# Patient Record
Sex: Female | Born: 1970
Health system: Southern US, Community
[De-identification: ages and names within clinical notes are randomized; demographics above are authoritative.]

## PROBLEM LIST (undated history)

## (undated) DIAGNOSIS — F1910 Other psychoactive substance abuse, uncomplicated: Secondary | ICD-10-CM

## (undated) DIAGNOSIS — N83209 Unspecified ovarian cyst, unspecified side: Secondary | ICD-10-CM

## (undated) DIAGNOSIS — M6740 Ganglion, unspecified site: Secondary | ICD-10-CM

## (undated) DIAGNOSIS — N879 Dysplasia of cervix uteri, unspecified: Secondary | ICD-10-CM

## (undated) DIAGNOSIS — F32A Depression, unspecified: Secondary | ICD-10-CM

## (undated) HISTORY — DX: Other psychoactive substance abuse, uncomplicated: F19.10

## (undated) HISTORY — DX: Dysplasia of cervix uteri, unspecified: N87.9

## (undated) HISTORY — PX: GYNECOLOGIC CRYOSURGERY: SHX857

## (undated) HISTORY — PX: TUBAL LIGATION: SHX77

## (undated) HISTORY — DX: Depression, unspecified: F32.A

## (undated) HISTORY — PX: OVARIAN CYST REMOVAL: SHX89

## (undated) HISTORY — DX: Unspecified ovarian cyst, unspecified side: N83.209

## (undated) HISTORY — DX: Ganglion, unspecified site: M67.40

---
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
## 1997-12-30 ENCOUNTER — Other Ambulatory Visit: Admission: RE | Admit: 1997-12-30 | Discharge: 1997-12-30 | Payer: Self-pay | Admitting: Obstetrics & Gynecology

## 1998-03-25 ENCOUNTER — Ambulatory Visit (HOSPITAL_COMMUNITY): Admission: RE | Admit: 1998-03-25 | Discharge: 1998-03-25 | Payer: Self-pay | Admitting: Obstetrics and Gynecology

## 1998-03-25 ENCOUNTER — Encounter: Payer: Self-pay | Admitting: Obstetrics and Gynecology

## 1998-04-23 ENCOUNTER — Ambulatory Visit (HOSPITAL_COMMUNITY): Admission: RE | Admit: 1998-04-23 | Discharge: 1998-04-23 | Payer: Self-pay | Admitting: *Deleted

## 1998-06-11 ENCOUNTER — Inpatient Hospital Stay (HOSPITAL_COMMUNITY): Admission: AD | Admit: 1998-06-11 | Discharge: 1998-06-14 | Payer: Self-pay | Admitting: *Deleted

## 2006-02-19 ENCOUNTER — Emergency Department (HOSPITAL_COMMUNITY): Admission: EM | Admit: 2006-02-19 | Discharge: 2006-02-19 | Payer: Self-pay | Admitting: Emergency Medicine

## 2006-12-31 ENCOUNTER — Emergency Department (HOSPITAL_COMMUNITY): Admission: EM | Admit: 2006-12-31 | Discharge: 2006-12-31 | Payer: Self-pay | Admitting: Family Medicine

## 2008-04-06 ENCOUNTER — Emergency Department (HOSPITAL_COMMUNITY): Admission: EM | Admit: 2008-04-06 | Discharge: 2008-04-06 | Payer: Self-pay | Admitting: Family Medicine

## 2008-10-09 ENCOUNTER — Emergency Department (HOSPITAL_COMMUNITY): Admission: EM | Admit: 2008-10-09 | Discharge: 2008-10-09 | Payer: Self-pay | Admitting: Family Medicine

## 2009-04-12 ENCOUNTER — Ambulatory Visit: Payer: Self-pay | Admitting: Family Medicine

## 2009-06-08 ENCOUNTER — Ambulatory Visit: Payer: Self-pay | Admitting: Family Medicine

## 2009-06-08 ENCOUNTER — Encounter: Admission: RE | Admit: 2009-06-08 | Discharge: 2009-06-08 | Payer: Self-pay | Admitting: Family Medicine

## 2010-11-02 ENCOUNTER — Encounter: Payer: Self-pay | Admitting: Family Medicine

## 2010-11-14 ENCOUNTER — Encounter: Payer: Self-pay | Admitting: Gynecology

## 2010-11-14 ENCOUNTER — Ambulatory Visit: Payer: Self-pay | Admitting: Gynecology

## 2010-11-14 ENCOUNTER — Other Ambulatory Visit (HOSPITAL_COMMUNITY)
Admission: RE | Admit: 2010-11-14 | Discharge: 2010-11-14 | Disposition: A | Discharge status: Home / Self Care | Payer: BC Managed Care – PPO | Source: Ambulatory Visit | Attending: Gynecology | Admitting: Gynecology

## 2010-11-14 ENCOUNTER — Ambulatory Visit (INDEPENDENT_AMBULATORY_CARE_PROVIDER_SITE_OTHER): Payer: BC Managed Care – PPO | Admitting: Gynecology

## 2010-11-14 DIAGNOSIS — Z01419 Encounter for gynecological examination (general) (routine) without abnormal findings: Secondary | ICD-10-CM | POA: Insufficient documentation

## 2010-11-14 DIAGNOSIS — N898 Other specified noninflammatory disorders of vagina: Secondary | ICD-10-CM

## 2010-11-14 DIAGNOSIS — Z13220 Encounter for screening for lipoid disorders: Secondary | ICD-10-CM

## 2010-11-14 DIAGNOSIS — B373 Candidiasis of vulva and vagina: Secondary | ICD-10-CM

## 2010-11-14 DIAGNOSIS — R823 Hemoglobinuria: Secondary | ICD-10-CM

## 2010-11-14 DIAGNOSIS — Z131 Encounter for screening for diabetes mellitus: Secondary | ICD-10-CM

## 2010-11-14 MED ORDER — FLUCONAZOLE 150 MG PO TABS: 150.0000 mg | ORAL_TABLET | Freq: Once | ORAL | Status: AC

## 2010-11-14 NOTE — Progress Notes (Signed)
Jade Richards 16-Sep-1970 [id number]        40 y.o.  for annual exam.  12 years since her last exam notes a vaginal discharge over the last several months.  Past medical history,surgical history, medications, allergies, family history and social history were all reviewed and documented in the EPIC chart. ROS:  Was performed and pertinent positives and negatives are included in the history.  Exam: chaperone present Filed Vitals:   11/14/10 1422  BP: 134/78   General appearance  Normal Skin grossly normal Head/Neck normal with no cervical or supraclavicular adenopathy thyroid normal Lungs  clear Cardiac RR, without RMG Abdominal  soft, nontender, without masses, organomegaly or hernia.  Midline scar noted Breasts  examined lying and sitting without masses, retractions, discharge or axillary adenopathy. Pelvic  Ext/BUS/vagina  normal with slight white discharge  Cervix  normal  Pap done  Uterus  anteverted, normal size, shape and contour, midline and mobile nontender   Adnexa  Without masses or tenderness    Anus and perineum  normal   Rectovaginal  normal sphincter tone without palpated masses or tenderness.    Assessment/Plan:  40 y.o. female for annual exam.    1. Vaginal discharge. KOH wet prep positive for yeast we'll treat with Diflucan 150x1 dose follow up if symptoms persist or recur. 2. Health maintenance. History of tubal sterilization. SBE monthly reviewed. Screening mammogram recommendation is between 35 and 40 were reviewed she plans on waiting till 40. Stop smoking discussed and encouraged. Baseline CBC glucose urinalysis lipid profile ordered. Assuming she continues well from a gynecologic standpoint then she will see [place] in a year sooner as needed. Pap was done and she has not had one done in 12 years. She has no history of significantly abnormal Pap smears.    Dara Lords MD, 3:02 PM 11/14/2010

## 2010-11-16 ENCOUNTER — Other Ambulatory Visit: Payer: Self-pay | Admitting: *Deleted

## 2010-11-16 DIAGNOSIS — E7800 Pure hypercholesterolemia, unspecified: Secondary | ICD-10-CM

## 2010-11-18 ENCOUNTER — Other Ambulatory Visit (INDEPENDENT_AMBULATORY_CARE_PROVIDER_SITE_OTHER): Payer: BC Managed Care – PPO | Admitting: *Deleted

## 2010-11-18 DIAGNOSIS — E7800 Pure hypercholesterolemia, unspecified: Secondary | ICD-10-CM

## 2010-12-02 ENCOUNTER — Encounter: Payer: Self-pay | Admitting: Medical

## 2010-12-02 ENCOUNTER — Ambulatory Visit (INDEPENDENT_AMBULATORY_CARE_PROVIDER_SITE_OTHER): Payer: BC Managed Care – PPO | Admitting: Medical

## 2010-12-02 DIAGNOSIS — R209 Unspecified disturbances of skin sensation: Secondary | ICD-10-CM

## 2010-12-02 DIAGNOSIS — K5900 Constipation, unspecified: Secondary | ICD-10-CM | POA: Insufficient documentation

## 2010-12-02 DIAGNOSIS — R202 Paresthesia of skin: Secondary | ICD-10-CM

## 2010-12-02 DIAGNOSIS — F17200 Nicotine dependence, unspecified, uncomplicated: Secondary | ICD-10-CM | POA: Insufficient documentation

## 2010-12-02 DIAGNOSIS — Z0000 Encounter for general adult medical examination without abnormal findings: Secondary | ICD-10-CM | POA: Insufficient documentation

## 2010-12-02 DIAGNOSIS — E785 Hyperlipidemia, unspecified: Secondary | ICD-10-CM

## 2010-12-02 LAB — POCT URINALYSIS DIPSTICK
Blood, UA: NEGATIVE
Ketones, UA: NEGATIVE
Spec Grav, UA: <=1.005
Urobilinogen, UA: NEGATIVE
pH, UA: 8

## 2010-12-02 MED ORDER — LUBIPROSTONE 24 MCG PO CAPS: 24.0000 ug | ORAL_CAPSULE | Freq: Two times a day (BID) | ORAL | Status: AC

## 2010-12-02 NOTE — Progress Notes (Signed)
Subjective:   HPI  Jade Richards is a 40 y.o. female who presents for a complete physical.    She reports that thinks have been going well, she has been in good health. She does have a few concerns though.  She reports gradual vision changes, some blurriness.  She notes hand and fingers bilat go numb frequently from forearm to fingers.   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   She had her gynecological exam recently for pap smear and had panel of labs done, was advised of elevated cholesterol.  She notes constipation problems longstanding since teenage years.  Has BM about 2 times per week.  Uses some OTC fiber supplement, stool softeners, but has failed Miralax.  No prior prescription medications for this.  No other c/o.     Past Medical History  Diagnosis Date  . Tobacco abuse   . Ganglion cyst     right foot    Past Surgical History  Procedure Date  . Ovarian cyst removal   . Appendectomy 1989  . Cesarean section     x3  . Tubal ligation     Family History  Problem Relation Age of Onset  . Cancer Mother     kidney  . Hypertension Mother   . Asthma Sister   . Cancer Maternal Uncle     kidney  . Diabetes Maternal Grandmother   . Heart disease Neg Hx   . Stroke Neg Hx     History   Social History  . Marital Status: Married    Spouse Name: N/A    Number of Children: N/A  . Years of Education: N/A   Occupational History  . Not on file.   Social History Main Topics  . Smoking status: Current Everyday Smoker -- 1.0 packs/day for 19 years    Types: Cigarettes  . Smokeless tobacco: Never Used  . Alcohol Use: 0.6 oz/week    1 Glasses of wine per week     weekly  . Drug Use: Yes    Special: Marijuana  . Sexually Active: Yes    Birth Control/ Protection: Surgical   Other Topics Concern  . Not on file   Social History Narrative   Married, has 4 children, works as Agricultural engineer at [employer].  Exercise - 1 mile on  treadmill and calisthenics, started in September 2012    Current Outpatient Prescriptions on File Prior to Visit  Medication Sig Dispense Refill  . ibuprofen (ADVIL,MOTRIN) 200 MG tablet Take 200 mg by mouth every 6 (six) hours as needed.          No Known Allergies  Review of Systems Constitutional: -fever, -chills, -sweats, -unexpected weight change, -anorexia, -fatigue Allergy: -sneezing, -itching, -congestion Dermatology: denies changing moles, rash, lumps, new worrisome lesions ENT: -runny nose, -ear pain, -sore throat, -hoarseness, -sinus pain, -teeth pain, -tinnitus, -hearing loss+epistaxis Cardiology:  -chest pain, -palpitations, -edema, -orthopnea, -paroxysmal nocturnal dyspnea Respiratory: -cough, -shortness of breath, -dyspnea on exertion, -wheezing, -hemoptysis Gastroenterology: -abdominal pain, -nausea, -vomiting, -diarrhea, +constipation, -blood in stool, -changes in bowel movement, -dysphagia Hematology: -bleeding or bruising problems Musculoskeletal: -arthralgias, -myalgias, -joint swelling, -back pain, -neck pain, +cramping, -gait changes Ophthalmology: -vision changes, -eye redness, -itching, -discharge Urology: -dysuria, +difficulty urinating, -hematuria, -urinary frequency, -urgency, incontinence Neurology: -headache, -weakness, -tingling, +numbness, -speech abnormality, -memory loss, -falls, -dizziness Psychology:  -depressed mood, -agitation, -sleep problems     Objective:   Physical Exam  Filed Vitals:  12/02/10 0907  BP: 100/70  Pulse: 84  Temp: 98.2 F (36.8 C)  Resp: 16    General appearance: alert, no distress, WD/WN, hispanic female Skin: unremarkable, no worrisome lesions HEENT: normocephalic, conjunctiva/corneas normal, sclerae anicteric, PERRLA, EOMi, nares patent, no discharge or erythema, pharynx normal Oral cavity: MMM, tongue normal, teeth in good repair Neck: supple, no lymphadenopathy, no thyromegaly, no masses, normal ROM, no  bruits Chest: non tender, normal shape and expansion Heart: RRR, normal S1, S2, no murmurs Lungs: CTA bilaterally, no wheezes, rhonchi, or rales Abdomen: +bs, soft,umbilical surgical scar,generalized tenderness, non distended, no masses, no hepatomegaly, no splenomegaly, no bruits Back: non tender, normal ROM, no scoliosis Musculoskeletal: upper extremities non tender, no obvious deformity, normal ROM throughout, lower extremities non tender, no obvious deformity, normal ROM throughout Extremities: no edema, no cyanosis, no clubbing Pulses: 2+ symmetric, upper and lower extremities, normal cap refill Neurological: alert, oriented x 3, CN2-12 intact, strength normal upper extremities and lower extremities, sensation normal throughout, DTRs 2+ throughout, no cerebellar signs, gait normal, tinel's and phalens negative Psychiatric: normal affect, behavior normal, pleasant  Breast/gyn/rectal - deferred to gynecology  Assessment and Plan :    Encounter Diagnoses  Name Primary?  . General medical examination Yes  . Paresthesia   . Constipation   . Tobacco use disorder   . Hyperlipidemia     Physical exam - discussed healthy lifestyle, diet, exercise, preventative care, vaccinations, and addressed their concerns.  Handout given.  She declines flu shot.  She reports last tdap UTD.  Reviewed labs from gynecology 11/14/10 - CBC normal, glucose normal, UA with blood, and lipids reviewed with elevated LDL. Pap normal. Handout given on wellness.  Paresthesia - trial of reinforced wrist splints at bedtime and labs today.  Constipation - trial of Amitiza, discussed dietary modifications.  Tobacco use - advised smoking cessation, call 1-800-QUIT-NOW for help.  Hyperlipidemia- reviewed recent labs from gynecology, LDL 141 and 161 recently, total 214-230, HDL normal.  Advised medication but she wants to work on lifestyle changes for 44mo then recheck.   Follow-up in pending labs

## 2010-12-02 NOTE — Patient Instructions (Signed)
YOU CAN QUIT SMOKING!  Talk to your medical provider about using medicines to help you quit. These include nicotine replacement gum, lozenges, or skin patches.  Consider calling 1-800-QUIT-NOW, a toll free 24/7 hotline with free counseling to help you quit.  If you are ready to quit smoking or are thinking about it, congratulations! You have chosen to help yourself be healthier and live longer! There are lots of different ways to quit smoking. Nicotine gum, nicotine patches, a nicotine inhaler, or nicotine nasal spray can help with physical craving. Hypnosis, support groups, and medicines help break the habit of smoking. TIPS TO GET OFF AND STAY OFF CIGARETTES  Learn to predict your moods. Do not let a bad situation be your excuse to have a cigarette. Some situations in your life might tempt you to have a cigarette.   Ask friends and co-workers not to smoke around you.   Make your home smoke-free.   Never have "just one" cigarette. It leads to wanting another and another. Remind yourself of your decision to quit.   On a card, make a list of your reasons for not smoking. Read it at least the same number of times a day as you have a cigarette. Tell yourself everyday, "I do not want to smoke. I choose not to smoke."   Ask someone at home or work to help you with your plan to quit smoking.   Have something planned after you eat or have a cup of coffee. Take a walk or get other exercise to perk you up. This will help to keep you from overeating.   Try a relaxation exercise to calm you down and decrease your stress. Remember, you may be tense and nervous the first two weeks after you quit. This will pass.   Find new activities to keep your hands busy. Play with a pen, coin, or rubber band. Doodle or draw things on paper.   Brush your teeth right after eating. This will help cut down the craving for the taste of tobacco after meals. You can try mouthwash too.   Try gum, breath mints, or diet  candy to keep something in your mouth.  IF YOU SMOKE AND WANT TO QUIT:  Do not stock up on cigarettes. Never buy a carton. Wait until one pack is finished before you buy another.   Never carry cigarettes with you at work or at home.   Keep cigarettes as far away from you as possible. Leave them with someone else.   Never carry matches or a lighter with you.   Ask yourself, "Do I need this cigarette or is this just a reflex?"   Bet with someone that you can quit. Put cigarette money in a piggy bank every morning. If you smoke, you give up the money. If you do not smoke, by the end of the week, you keep the money.   Keep trying. It takes 21 days to change a habit!  Document Released: 10/29/2008 Document Revised: 09/14/2010 Document Reviewed: 10/29/2008 [employer] Patient Information 2012 [employer].   Preventative Care for Adults - Female      MAINTAIN REGULAR HEALTH EXAMS:  A routine yearly physical is a good way to check in with your primary care provider about your health and preventive screening. It is also an opportunity to share updates about your health and any concerns you have, and receive a thorough all-over exam.   Most health insurance companies pay for at least some preventative services.  Check   with your health plan for specific coverages.  WHAT PREVENTATIVE SERVICES DO WOMEN NEED?  Adult women should have their weight and blood pressure checked regularly.   Women age 35 and older should have their cholesterol levels checked regularly.  Women should be screened for cervical cancer with a Pap smear and pelvic exam beginning at either age 21, or 3 years after they become sexually activity.    Breast cancer screening generally begins at age 40 with a mammogram and breast exam by your primary care provider.    Beginning at age 50 and continuing to age 75, women should be screened for colorectal cancer.  Certain people may need continued testing until age  85.  Updating vaccinations is part of preventative care.  Vaccinations help protect against diseases such as the flu.  Osteoporosis is a disease in which the bones lose minerals and strength as we age. Women ages 65 and over should discuss this with their caregivers, as should women after menopause who have other risk factors.  Lab tests are generally done as part of preventative care to screen for anemia and blood disorders, to screen for problems with the kidneys and liver, to screen for bladder problems, to check blood sugar, and to check your cholesterol level.  Preventative services generally include counseling about diet, exercise, avoiding tobacco, drugs, excessive alcohol consumption, and sexually transmitted infections.    GENERAL RECOMMENDATIONS FOR GOOD HEALTH:  Healthy diet:  Eat a variety of foods, including fruit, vegetables, animal or vegetable protein, such as meat, fish, chicken, and eggs, or beans, lentils, tofu, and grains, such as rice.  Drink plenty of water daily.  Decrease saturated fat in the diet, avoid lots of red meat, processed foods, sweets, fast foods, and fried foods.  Exercise:  Aerobic exercise helps maintain good heart health. At least 30-40 minutes of moderate-intensity exercise is recommended. For example, a brisk walk that increases your heart rate and breathing. This should be done on most days of the week.   Find a type of exercise or a variety of exercises that you enjoy so that it becomes a part of your daily life.  Examples are running, walking, swimming, water aerobics, and biking.  For motivation and support, explore group exercise such as aerobic class, spin class, Zumba, Yoga,or  martial arts, etc.    Set exercise goals for yourself, such as a certain weight goal, walk or run in a race such as a 5k walk/run.  Speak to your primary care provider about exercise goals.  Disease prevention:  If you smoke or chew tobacco, find out from your  caregiver how to quit. It can literally save your life, no matter how long you have been a tobacco user. If you do not use tobacco, never begin.   Maintain a healthy diet and normal weight. Increased weight leads to problems with blood pressure and diabetes.   The Body Mass Index or BMI is a way of measuring how much of your body is fat. Having a BMI above 27 increases the risk of heart disease, diabetes, hypertension, stroke and other problems related to obesity. Your caregiver can help determine your BMI and based on it develop an exercise and dietary program to help you achieve or maintain this important measurement at a healthful level.  High blood pressure causes heart and blood vessel problems.  Persistent high blood pressure should be treated with medicine if weight loss and exercise do not work.   Fat and cholesterol leaves deposits   in your arteries that can block them. This causes heart disease and vessel disease elsewhere in your body.  If your cholesterol is found to be high, or if you have heart disease or certain other medical conditions, then you may need to have your cholesterol monitored frequently and be treated with medication.   Ask if you should have a cardiac stress test if your history suggests this. A stress test is a test done on a treadmill that looks for heart disease. This test can find disease prior to there being a problem.  Menopause can be associated with physical symptoms and risks. Hormone replacement therapy is available to decrease these. You should talk to your caregiver about whether starting or continuing to take hormones is right for you.   Osteoporosis is a disease in which the bones lose minerals and strength as we age. This can result in serious bone fractures. Risk of osteoporosis can be identified using a bone density scan. Women ages 65 and over should discuss this with their caregivers, as should women after menopause who have other risk factors. Ask your  caregiver whether you should be taking a calcium supplement and Vitamin D, to reduce the rate of osteoporosis.   Avoid drinking alcohol in excess (more than two drinks per day).  Avoid use of street drugs. Do not share needles with anyone. Ask for professional help if you need assistance or instructions on stopping the use of alcohol, cigarettes, and/or drugs.  Brush your teeth twice a day with fluoride toothpaste, and floss once a day. Good oral hygiene prevents tooth decay and gum disease. The problems can be painful, unattractive, and can cause other health problems. Visit your dentist for a routine oral and dental check up and preventive care every 6-12 months.   Look at your skin regularly.  Use a mirror to look at your back. Notify your caregivers of changes in moles, especially if there are changes in shapes, colors, a size larger than a pencil eraser, an irregular border, or development of new moles.  Safety:  Use seatbelts 100% of the time, whether driving or as a passenger.  Use safety devices such as hearing protection if you work in environments with loud noise or significant background noise.  Use safety glasses when doing any work that could send debris in to the eyes.  Use a helmet if you ride a bike or motorcycle.  Use appropriate safety gear for contact sports.  Talk to your caregiver about gun safety.  Use sunscreen with a SPF (or skin protection factor) of 15 or greater.  Lighter skinned people are at a greater risk of skin cancer. Don't forget to also wear sunglasses in order to protect your eyes from too much damaging sunlight. Damaging sunlight can accelerate cataract formation.   Practice safe sex. Use condoms. Condoms are used for birth control and to help reduce the spread of sexually transmitted infections (or STIs).  Some of the STIs are gonorrhea (the clap), chlamydia, syphilis, trichomonas, herpes, HPV (human papilloma virus) and HIV (human immunodeficiency virus) which  causes AIDS. The herpes, HIV and HPV are viral illnesses that have no cure. These can result in disability, cancer and death.   Keep carbon monoxide and smoke detectors in your home functioning at all times. Change the batteries every 6 months or use a model that plugs into the wall.   Vaccinations:  Stay up to date with your tetanus shots and other required immunizations. You should have a booster   for tetanus every 10 years. Be sure to get your flu shot every year, since 5%-20% of the [place] population comes down with the flu. The flu vaccine changes each year, so being vaccinated once is not enough. Get your shot in the fall, before the flu season peaks.   Other vaccines to consider:  Human Papilloma Virus or HPV causes cancer of the cervix, and other infections that can be transmitted from person to person. There is a vaccine for HPV, and females should get immunized between the ages of 11 and 26. It requires a series of 3 shots.   Pneumococcal vaccine to protect against certain types of pneumonia.  This is normally recommended for adults age 65 or older.  However, adults younger than 39 years old with certain underlying conditions such as diabetes, heart or lung disease should also receive the vaccine.  Shingles vaccine to protect against Varicella Zoster if you are older than age 60, or younger than 39 years old with certain underlying illness.  Hepatitis A vaccine to protect against a form of infection of the liver by a virus acquired from food.  Hepatitis B vaccine to protect against a form of infection of the liver by a virus acquired from blood or body fluids, particularly if you work in health care.  If you plan to travel internationally, check with your local health department for specific vaccination recommendations.  Cancer Screening:  Breast cancer screening is essential to preventive care for women. All women age 20 and older should perform a breast self-exam every month. At age  40 and older, women should have their caregiver complete a breast exam each year. Women at ages 40 and older should have a mammogram (x-ray film) of the breasts. Your caregiver can discuss how often you need mammograms.    Cervical cancer screening includes taking a Pap smear (sample of cells examined under a microscope) from the cervix (end of the uterus). It also includes testing for HPV (Human Papilloma Virus, which can cause cervical cancer). Screening and a pelvic exam should begin at age 21, or 3 years after a woman becomes sexually active. Screening should occur every year, with a Pap smear but no HPV testing, up to age 30. After age 30, you should have a Pap smear every 3 years with HPV testing, if no HPV was found previously.   Most routine colon cancer screening begins at the age of 50. On a yearly basis, doctors may provide special easy to use take-home tests to check for hidden blood in the stool. Sigmoidoscopy or colonoscopy can detect the earliest forms of colon cancer and is life saving. These tests use a small camera at the end of a tube to directly examine the colon. Speak to your caregiver about this at age 50, when routine screening begins (and is repeated every 5 years unless early forms of pre-cancerous polyps or small growths are found).     

## 2010-12-03 LAB — COMPREHENSIVE METABOLIC PANEL
Albumin: 4.6 g/dL (ref 3.5–5.2)
Alkaline Phosphatase: 63 U/L (ref 39–117)
BUN: 11 mg/dL (ref 6–23)
CO2: 25 mEq/L (ref 19–32)
Glucose, Bld: 80 mg/dL (ref 70–99)
Sodium: 142 mEq/L (ref 135–145)
Total Bilirubin: 0.5 mg/dL (ref 0.3–1.2)
Total Protein: 7.2 g/dL (ref 6.0–8.3)

## 2010-12-03 LAB — VITAMIN B12: Vitamin B-12: 489 pg/mL (ref 211–911)

## 2011-02-27 ENCOUNTER — Ambulatory Visit
Admission: RE | Admit: 2011-02-27 | Discharge: 2011-02-27 | Disposition: A | Discharge status: Home / Self Care | Payer: BC Managed Care – PPO | Source: Ambulatory Visit | Attending: Medical | Admitting: Medical

## 2011-02-27 ENCOUNTER — Ambulatory Visit (INDEPENDENT_AMBULATORY_CARE_PROVIDER_SITE_OTHER): Payer: BC Managed Care – PPO | Admitting: Medical

## 2011-02-27 ENCOUNTER — Encounter: Payer: Self-pay | Admitting: Medical

## 2011-02-27 VITALS — BP 110/70 | HR 80 | Temp 98.2°F | Resp 16 | Wt 159.0 lb

## 2011-02-27 DIAGNOSIS — M25519 Pain in unspecified shoulder: Secondary | ICD-10-CM

## 2011-02-27 DIAGNOSIS — M79672 Pain in left foot: Secondary | ICD-10-CM | POA: Insufficient documentation

## 2011-02-27 DIAGNOSIS — M25512 Pain in left shoulder: Secondary | ICD-10-CM | POA: Insufficient documentation

## 2011-02-27 DIAGNOSIS — M79609 Pain in unspecified limb: Secondary | ICD-10-CM

## 2011-02-27 MED ORDER — DICLOFENAC SODIUM 75 MG PO TBEC: 75.0000 mg | DELAYED_RELEASE_TABLET | Freq: Two times a day (BID) | ORAL | Status: DC

## 2011-02-27 NOTE — Progress Notes (Signed)
Subjective:   HPI  Jade Richards is a 41 y.o. female who presents for c/o shoulder and foot pain.    She notes left shoulder pain for over 1 year, but denies specific injury or trauma.  Over the last few weeks thought, the shoulder pain has progressively gotten worse, radiating down arm and up into her neck.  She notes night time pain frequently.  She sleeps on her right side.  She notes occasionally numbness in the left arm and hand in general.  She notes worse pain with overhead motion.  She denies prior eval for this.  She denies neck pain otherwise. 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  She reports hx/o right foot pain due to cyst on top of her foot.  This has really not been an issue, but in the last 2 weeks she has left foot pain on top of her foot similarly.  Nothing improves this.  No other aggravating or relieving factors.    She is using Ibuprofen 800mg  BID for the pains.  No other c/o.  The following portions of the patient's history were reviewed and updated as appropriate: allergies, current medications, past family history, past medical history, past social history, past surgical history and problem list.  Past Medical History  Diagnosis Date  . Tobacco abuse   . Ganglion cyst     right foot    No Known Allergies  No current outpatient prescriptions on file prior to visit.     Review of Systems Constitutional: denies fever, chills, sweats, Musculoskeletal: denies joint swelling, back pain Neurology: no weakness, tingling      Objective:   Physical Exam  General appearance: alert, no distress, WD/WN Neck: supple,  Mild left lateral neck tender, otherwise nontender, no lymphadenopathy, no thyromegaly, no masses Back: tender left supraspinatus and trapezius MSK: tender over left generalized deltoid, tender over AC jt, tender over anterior humerus, decrease left external ROM, decrease strength with lift off, pain with left shoulder flexion,  pain with crossover and empty can test, pain with labral tests, mild pain with apprehension test, but left shoulder strength seems relatively normal otherwise, no obvious swelling, rest of left and right arm exams normal.  Left dorsal foot over tarsal/metatarsal region with point tenderness, what appears to be some bony spurring, but otherwise no obvious cyst or deformity otherwise, rest of foot without deformity, nontender Pulses: 2+ symmetric, upper and lower extremities, normal cap refill Neuro: normal bilat UE and LE sensation, strength and DTRs   Assessment and Plan :     Encounter Diagnoses  Name Primary?  . Shoulder pain, left Yes  . Foot pain, left    Shoulder pain - discussed symptoms and exam with differential that could include possible mild rotator cuff tear vs labral problem vs entrapments vs adhesive capsulitis.  Etiology not clear though as she is positive on several tests.  Will send for xray.  Assuming no obvious arthritis, will send for trial of PT, advised she begin Chondroitin Glucosamine OTC, script for Voltaren, and recheck 3-4 wk.  Foot pain - likely mild spurring causing some rubbing on tendons.  Discussed some supportive care, xray today, and if not improving in 2-3 wk, consider podiatry referral.

## 2011-02-27 NOTE — Patient Instructions (Signed)
We will refer you to physical therapy.  Begin OTC Glucosamine and Chondroitin once to twice daily.  I wrote prescription today for Voltaren 75mg  twice daily for pain and inflammation.    Shoulder Pain The shoulder is a ball and socket joint. The muscles and tendons (rotator cuff) are what keep the shoulder in its joint and stable. This collection of muscles and tendons holds in the head (ball) of the humerus (upper arm bone) in the fossa (cup) of the scapula (shoulder blade). Today no reason was found for your shoulder pain. Often pain in the shoulder may be treated conservatively with temporary immobilization. For example, holding the shoulder in one place using a sling for rest. Physical therapy may be needed if problems continue. HOME CARE INSTRUCTIONS   Apply ice to the sore area for 15 to 20 minutes, 3 to 4 times per day for the first 2 days. Put the ice in a plastic bag. Place a towel between the bag of ice and your skin.   If you have or were given a shoulder sling and straps, do not remove for as long as directed by your caregiver or until you see a caregiver for a follow-up examination. If you need to remove it to shower or bathe, move your arm as little as possible.   Sleep on several pillows at night to lessen swelling and pain.   Only take over-the-counter or prescription medicines for pain, discomfort, or fever as directed by your caregiver.   Keep any follow-up appointments in order to avoid any type of permanent shoulder disability or chronic pain problems.  SEEK MEDICAL CARE IF:   Pain in your shoulder increases or new pain develops in your arm, hand, or fingers.   Your hand or fingers are colder than your other hand.   You do not obtain pain relief with the medications or your pain becomes worse.  SEEK IMMEDIATE MEDICAL CARE IF:   Your arm, hand, or fingers are numb or tingling.   Your arm, hand, or fingers are swollen, painful, or turn white or blue.   You develop chest  pain or shortness of breath.  MAKE SURE YOU:   Understand these instructions.   Will watch your condition.   Will get help right away if you are not doing well or get worse.  Document Released: 10/12/2004 Document Revised: 09/14/2010 Document Reviewed: 05/17/2006 [facility] Patient Information 2012 [place], [place].

## 2011-02-28 NOTE — Progress Notes (Signed)
Martie Lee spoke with pt husband ( he is on [facility])  About result notes.  Pt will call back in 2 weeks if still having foot pain for her referral.  Pt will aos reck 3 - 4 weeks for shoulder.

## 2011-12-06 ENCOUNTER — Ambulatory Visit (INDEPENDENT_AMBULATORY_CARE_PROVIDER_SITE_OTHER): Payer: BC Managed Care – PPO | Admitting: Medical

## 2011-12-06 ENCOUNTER — Encounter: Payer: Self-pay | Admitting: Medical

## 2011-12-06 VITALS — BP 110/80 | HR 88 | Temp 98.3°F | Resp 16 | Wt 162.0 lb

## 2011-12-06 DIAGNOSIS — M25519 Pain in unspecified shoulder: Secondary | ICD-10-CM

## 2011-12-06 DIAGNOSIS — R110 Nausea: Secondary | ICD-10-CM

## 2011-12-06 DIAGNOSIS — R1013 Epigastric pain: Secondary | ICD-10-CM

## 2011-12-06 DIAGNOSIS — M79673 Pain in unspecified foot: Secondary | ICD-10-CM

## 2011-12-06 DIAGNOSIS — R630 Anorexia: Secondary | ICD-10-CM

## 2011-12-06 DIAGNOSIS — M79609 Pain in unspecified limb: Secondary | ICD-10-CM

## 2011-12-06 MED ORDER — DEXLANSOPRAZOLE 60 MG PO CPDR: 60.0000 mg | DELAYED_RELEASE_CAPSULE | Freq: Every day | ORAL | Status: DC

## 2011-12-06 MED ORDER — SUCRALFATE 1 G PO TABS: ORAL_TABLET | ORAL | Status: DC

## 2011-12-06 NOTE — Patient Instructions (Signed)
Take the Dexilant daily, 45 mintues before breakfast once daily.  Make sure you take the Dexilant 30 minutes at least before Sucralfate dose.  Take sucralfate tablet 1 hour before meals.    Avoid reflux triggers.   Ulcer Disease You have an ulcer. This may be in your stomach (gastric ulcer) or in the first part of your small bowel, the duodenum (duodenal ulcer). An ulcer is a break in the lining of the stomach or duodenum. The ulcer causes erosion into the deeper tissue. CAUSES  The stomach has a lining to protect itself from the acid that digests food. The lining can be damaged in two main ways:  The Helico Pylori bacteria (H. Pyolori) can infect the lining of the stomach and cause ulcers.  Nonsteroidal, anti-inflammatory medications (NSAIDS) can cause gastric ulcerations.  Smoking tobacco can increase the acid in the stomach. This can lead to ulcers, and will impair healing of ulcers. Other factors, such as alcohol use and stress may contribute to ulcer formation. Rarely, a tumor or cancer can cause an ulcer.  SYMPTOMS  The problems (symptoms) of ulcer disease are usually a burning or gnawing of the mid-upper belly (abdomen). This is often worse on an empty stomach and may get better with food. This may be associated with feeling sick to your stomach (nausea), bloating, and vomiting. If the ulcer results in bleeding, it can cause:  Black, tarry stools.  Vomiting of bright red blood.  Vomiting coffee-ground-looking materials. With severe bleeding, there may be loss of consciousness and shock.  DIAGNOSIS  Learning what is wrong (diagnosis) is usually made based upon your history and an exam. Medications are so effective that further tests may not be necessary. If needed, other tests may include:  Blood tests, x-rays, or heart tests. These are done to be sure that no other conditions are causing your symptoms.  X-rays (Barium studies) such as an upper GI series.  Most commonly, an  upper GI endoscopy can confirm your diagnosis. This is when a flexible tube is passed through the mouth (using sedative medication). The tube is used to look at the inside of esophagus, stomach, and small bowel. Abnormal pieces of tissue may be removed to examine under the microscope (biopsy). TREATMENT  Bleeding from ulcers can usually be treated via endoscopy. Rarely, surgery is needed for ulcers when bleeding cannot be stopped. Surgery is needed if the ulcer goes through the wall of the stomach or duodenum.  After any bleeding is stopped, medications are the main treatment:  If your ulcer was caused by the bacteria H. Pylori, then you will need antibiotics to kill the infection. Usually, a program involving more than one antibiotic, and a medicine for stomach acid, is prescribed.  Medicine to decrease acid production is used for almost all ulcers. Your caregiver will make a recommendation. They will also tell you how long you must use the medication.  Stop using any medications or substances that may have contributed to your ulcer (alcohol, tobacco, caffeine, for example).  Medications are available that protect the lining of the bowel. HOME CARE INSTRUCTIONS   Continue regular work and usual activities unless advised otherwise by your caregiver.  Avoid tobacco, alcohol, and caffeine. Tobacco use will decrease and slow the rates of healing.  Avoid foods that seem to aggravate or cause discomfort.  There are many over-the-counter products available to control stomach acid and other symptoms. Discuss these with your caregiver before using them. DO NOT substitute over-the-counter medications for prescription  medications without discussing with your caregiver.  Special diets are not usually needed.  Keep any follow-up appointments and blood tests, as directed. SEEK MEDICAL CARE IF:   Your pain or other ulcer symptoms do not improve within a few days of starting treatment.  You develop  diarrhea. This can be a complication of certain treatments.  You have ongoing indigestion or heartburn, even if your main ulcer symptoms are improved. SEEK IMMEDIATE MEDICAL CARE IF:   You develop bright red, rectal bleeding; dark black, tarry stools; or vomit blood.  You become light-headed, weak, have fainting episodes, or become sweaty, cold and clammy.  You experience severe abdominal pain not controlled by medications. DO NOT take pain medications unless ordered by your caregiver. Document Released: 10/12/2004 Document Revised: 03/27/2011 Document Reviewed: 08/30/2006 [facility] Patient Information 2013 [place], [place].

## 2011-12-06 NOTE — Progress Notes (Signed)
Subjective: Here for abdominal pain.  She reports in the last 50mo has had 4 episodes of severe epigastric abdominal pain.  Feels like her stomach lining is irritated.  This awoke her at 12:30 am this morning.  Gets it primarily in epigastric region without radiation, has increased belching, is intermittent, burning, sharp.  Denies cough, heartburn, froth in mouth.  She does drink 2 cups of coffee daily, some tomato based foods, but no other frequency spicy, hot, or citrus foods.  She does take Ibuprofen at least 3 days per week.  Used some Zegerid in the past with some relief, used Prilosec once before.  No other aggravating or relieving factors.  No prior EGD or GI eval.  Of note, I saw her months ago for shoulder and foot pain.  She ended up seeing orthopedics about the foot.  Has just dealt with the pain and using OTC NSAIDs with some frequency.  Past Medical History  Diagnosis Date  . Tobacco abuse   . Ganglion cyst     right foot   ROS as in HPI  Objective: Gen: wd, wn, nad Skin: warm, dry Oral: MMM, no lesions, no frothy sputum in back of throat, mild erythema though Heart: RRR, normal S1, S2, no murmurs Lungs: CTA abdomen: +bs, soft, quite tender epigastric area, mild tenderness RUQ, LUQ, otherwise nontender, no mass, no organomegaly, no rebound Pulses: 2+  Assessment: Encounter Diagnoses  Name Primary?  . Epigastric abdominal pain Yes  . Nausea   . Anorexia   . Foot pain   . Shoulder pain     Plan: Lab for h pylori.  Begin samples of Dexilant, also gave script for sucralfate to use 1 hr prior to meals, take Dexilant 30 min prior to sucralfate in the morning.  Avoid food triggers, cut back on caffeine.  F/u pending lab and in 2 wk.  If shoulder and foot pain c/t, f/u with ortho for other treatment options, since NSAIDs may be aggravating this epigastric issue, possible ulcer.

## 2011-12-07 ENCOUNTER — Encounter: Payer: Self-pay | Admitting: Medical

## 2012-05-31 ENCOUNTER — Encounter: Payer: Self-pay | Admitting: Gynecology

## 2012-05-31 ENCOUNTER — Other Ambulatory Visit (HOSPITAL_COMMUNITY)
Admission: RE | Admit: 2012-05-31 | Discharge: 2012-05-31 | Disposition: A | Discharge status: Home / Self Care | Payer: BC Managed Care – PPO | Source: Ambulatory Visit | Attending: Gynecology | Admitting: Gynecology

## 2012-05-31 ENCOUNTER — Ambulatory Visit (INDEPENDENT_AMBULATORY_CARE_PROVIDER_SITE_OTHER): Payer: BC Managed Care – PPO | Admitting: Gynecology

## 2012-05-31 VITALS — BP 120/68 | Ht 64.0 in | Wt 146.0 lb

## 2012-05-31 DIAGNOSIS — Z01419 Encounter for gynecological examination (general) (routine) without abnormal findings: Secondary | ICD-10-CM

## 2012-05-31 DIAGNOSIS — Z13220 Encounter for screening for lipoid disorders: Secondary | ICD-10-CM

## 2012-05-31 DIAGNOSIS — Z1151 Encounter for screening for human papillomavirus (HPV): Secondary | ICD-10-CM | POA: Insufficient documentation

## 2012-05-31 LAB — CBC WITH DIFFERENTIAL/PLATELET
Eosinophils Absolute: 0 10*3/uL (ref 0.0–0.7)
Eosinophils Relative: 1 % (ref 0–5)
HCT: 38 % (ref 36.0–46.0)
Hemoglobin: 13.2 g/dL (ref 12.0–15.0)
Lymphocytes Relative: 32 % (ref 12–46)
Lymphs Abs: 2.2 10*3/uL (ref 0.7–4.0)
MCH: 31.5 pg (ref 26.0–34.0)
MCV: 90.7 fL (ref 78.0–100.0)
Monocytes Absolute: 0.4 10*3/uL (ref 0.1–1.0)
Monocytes Relative: 6 % (ref 3–12)
RBC: 4.19 MIL/uL (ref 3.87–5.11)
WBC: 7 10*3/uL (ref 4.0–10.5)

## 2012-05-31 LAB — COMPREHENSIVE METABOLIC PANEL
ALT: 10 U/L (ref 0–35)
BUN: 13 mg/dL (ref 6–23)
CO2: 28 mEq/L (ref 19–32)
Calcium: 9.8 mg/dL (ref 8.4–10.5)
Chloride: 104 mEq/L (ref 96–112)
Creat: 0.76 mg/dL (ref 0.50–1.10)
Glucose, Bld: 79 mg/dL (ref 70–99)
Total Bilirubin: 0.5 mg/dL (ref 0.3–1.2)

## 2012-05-31 LAB — LIPID PANEL
Cholesterol: 213 mg/dL — ABNORMAL HIGH (ref 0–200)
Total CHOL/HDL Ratio: 4.3 Ratio
Triglycerides: 108 mg/dL (ref <150)

## 2012-05-31 NOTE — Patient Instructions (Addendum)
Follow up for annual exam in one year  Call to Schedule your mammogram  Facilities in [place]: 1)  [facility] [place], [place] Phone: [phone] 2)  [facility]. [facility], [phone] Phone: [phone] 3)  Dr. Yolanda Bonine at [facility] [address] Phone: [phone]     Mammogram A mammogram is an X-ray test to find changes in a woman's breast. You should get a mammogram if:  You are 50 years of age or older  You have risk factors.   Your doctor recommends that you have one.  BEFORE THE TEST  Do not schedule the test the week before your period, especially if your breasts are sore during this time.  On the day of your mammogram:  Wash your breasts and armpits well. After washing, do not put on any deodorant or talcum powder on until after your test.   Eat and drink as you usually do.   Take your medicines as usual.   If you are diabetic and take insulin, make sure you:   Eat before coming for your test.   Take your insulin as usual.   If you cannot keep your appointment, call before the appointment to cancel. Schedule another appointment.  TEST  You will need to undress from the waist up. You will put on a hospital gown.   Your breast will be put on the mammogram machine, and it will press firmly on your breast with a piece of plastic called a compression paddle. This will make your breast flatter so that the machine can X-ray all parts of your breast.   Both breasts will be X-rayed. Each breast will be X-rayed from above and from the side. An X-ray might need to be taken again if the picture is not good enough.   The mammogram will last about 15 to 30 minutes.  AFTER THE TEST Finding out the results of your test Ask when your test results will be ready. Make sure you get your test results.  Document Released: 03/31/2008 Document Revised: 12/22/2010 Document  Reviewed: 03/31/2008 [facility] Patient Information 2012 [place], [place].  Consider Stop Smoking.  Help is available at [facility] smoking cessation program @ www.[place].com or 216-637-9702. OR 1-800-QUIT-NOW 3104666662) for free smoking cessation counseling.  Smokefree.gov (http://www.[place].com/) provides free, accurate, evidence-based information and professional assistance to help support the immediate and long-term needs of people trying to quit smoking.    Smoking Hazards Smoking cigarettes is extremely bad for your health. Tobacco smoke has over 200 known poisons in it. There are over 60 chemicals in tobacco smoke that cause cancer. Some of the chemicals found in cigarette smoke include:  Cyanide.  Benzene.  Formaldehyde.  Methanol (wood alcohol).  Acetylene (fuel used in welding torches).  Ammonia.  Cigarette smoke also contains the poisonous gases nitrogen oxide and carbon monoxide.  Cigarette smokers have an increased risk of many serious medical problems, including: Lung cancer.  Lung disease (such as pneumonia, bronchitis, and emphysema).  Heart attack and chest pain due to the heart not getting enough oxygen (angina).  Heart disease and peripheral blood vessel disease.  Hypertension.  Stroke.  Oral cancer (cancer of the lip, mouth, or voice box).  Bladder cancer.  Pancreatic cancer.  Cervical cancer.  Pregnancy complications, including premature birth.  Low birthweight babies.  Early menopause.  Lower estrogen level for women.  Infertility.  Facial wrinkles.  Blindness.  Increased risk of broken bones (fractures).  Senile dementia.  Stillbirths and smaller newborn babies, birth defects, and genetic damage to sperm.  Stomach ulcers and internal bleeding.  Children of smokers have an increased risk of the following, because of secondhand smoke exposure:  Sudden infant death syndrome (SIDS).  Respiratory infections.  Lung cancer.  Heart disease.   Ear infections.  Smoking causes approximately: 90% of all lung cancer deaths in men.  80% of all lung cancer deaths in women.  90% of deaths from chronic obstructive lung disease.  Compared with nonsmokers, smoking increases the risk of: Coronary heart disease by 2 to 4 times.  Stroke by 2 to 4 times.  Men developing lung cancer by 23 times.  Women developing lung cancer by 13 times.  Dying from chronic obstructive lung diseases by 12 times.  Someone who smokes 2 packs a day loses about 8 years of his or her life. Even smoking lightly shortens your life expectancy by several years. You can greatly reduce the risk of medical problems for you and your family by stopping now. Smoking is the most preventable cause of death and disease in our society. Within days of quitting smoking, your circulation returns to normal, you decrease the risk of having a heart attack, and your lung capacity improves. There may be some increased phlegm in the first few days after quitting, and it may take months for your lungs to clear up completely. Quitting for 10 years cuts your lung cancer risk to almost that of a nonsmoker. WHY IS SMOKING ADDICTIVE? Nicotine is the chemical agent in tobacco that is capable of causing addiction or dependence.  When you smoke and inhale, nicotine is absorbed rapidly into the bloodstream through your lungs. Nicotine absorbed through the lungs is capable of creating a powerful addiction. Both inhaled and non-inhaled nicotine may be addictive.  Addiction studies of cigarettes and spit tobacco show that addiction to nicotine occurs mainly during the teen years, when young people begin using tobacco products.  WHAT ARE THE BENEFITS OF QUITTING?  There are many health benefits to quitting smoking.  Likelihood of developing cancer and heart disease decreases. Health improvements are seen almost immediately.  Blood pressure, pulse rate, and breathing patterns start returning to normal soon  after quitting.  People who quit may see an improvement in their overall quality of life.  Some people choose to quit all at once. Other options include nicotine replacement products, such as patches, gum, and nasal sprays. Do not use these products without first checking with your caregiver. QUITTING SMOKING It is not easy to quit smoking. Nicotine is addicting, and longtime habits are hard to change. To start, you can write down all your reasons for quitting, tell your family and friends you want to quit, and ask for their help. Throw your cigarettes away, chew gum or cinnamon sticks, keep your hands busy, and drink extra water or juice. Go for walks and practice deep breathing to relax. Think of all the money you are saving: around $1,000 a year, for the average pack-a-day smoker. Nicotine patches and gum have been shown to improve success at efforts to stop smoking. Zyban (bupropion) is an anti-depressant drug that can be prescribed to reduce nicotine withdrawal symptoms and to suppress the urge to smoke. Smoking is an addiction with both physical and psychological effects. Joining a stop-smoking support group can help you cope with the emotional issues. For more information and advice on programs to stop smoking, call your  doctor, your local hospital, or these organizations: American Lung Association - 1-800-LUNGUSA   Smoking Cessation  This document explains the best ways for you to quit smoking and new treatments to help. It lists new medicines that can double or triple your chances of quitting and quitting for good. It also considers ways to avoid relapses and concerns you may have about quitting, including weight gain. NICOTINE: A POWERFUL ADDICTION If you have tried to quit smoking, you know how hard it can be. It is hard because nicotine is a very addictive drug. For some people, it can be as addictive as heroin or cocaine. Usually, people make 2 or 3 tries, or more, before finally being able  to quit. Each time you try to quit, you can learn about what helps and what hurts. Quitting takes hard work and a lot of effort, but you can quit smoking. QUITTING SMOKING IS ONE OF THE MOST IMPORTANT THINGS YOU WILL EVER DO.  You will live longer, feel better, and live better.   The impact on your body of quitting smoking is felt almost immediately:   Within 20 minutes, blood pressure decreases. Pulse returns to its normal level.   After 8 hours, carbon monoxide levels in the blood return to normal. Oxygen level increases.   After 24 hours, chance of heart attack starts to decrease. Breath, hair, and body stop smelling like smoke.   After 48 hours, damaged nerve endings begin to recover. Sense of taste and smell improve.   After 72 hours, the body is virtually free of nicotine. Bronchial tubes relax and breathing becomes easier.   After 2 to 12 weeks, lungs can hold more air. Exercise becomes easier and circulation improves.   Quitting will reduce your risk of having a heart attack, stroke, cancer, or lung disease:   After 1 year, the risk of coronary heart disease is cut in half.   After 5 years, the risk of stroke falls to the same as a nonsmoker.   After 10 years, the risk of lung cancer is cut in half and the risk of other cancers decreases significantly.   After 15 years, the risk of coronary heart disease drops, usually to the level of a nonsmoker.   If you are pregnant, quitting smoking will improve your chances of having a healthy baby.   The people you live with, especially your children, will be healthier.   You will have extra money to spend on things other than cigarettes.  FIVE KEYS TO QUITTING Studies have shown that these 5 steps will help you quit smoking and quit for good. You have the best chances of quitting if you use them together: 1. Get ready.  2. Get support and encouragement.  3. Learn new skills and behaviors.  4. Get medicine to reduce your nicotine  addiction and use it correctly.  5. Be prepared for relapse or difficult situations. Be determined to continue trying to quit, even if you do not succeed at first.  1. GET READY  Set a quit date.   Change your environment.   Get rid of ALL cigarettes, ashtrays, matches, and lighters in your home, car, and place of work.   Do not let people smoke in your home.   Review your past attempts to quit. Think about what worked and what did not.   Once you quit, do not smoke. NOT EVEN A PUFF!  2. GET SUPPORT AND ENCOURAGEMENT Studies have shown that you have a better chance  of being successful if you have help. You can get support in many ways.  Tell your family, friends, and coworkers that you are going to quit and need their support. Ask them not to smoke around you.   Talk to your caregivers (doctor, dentist, nurse, pharmacist, psychologist, and/or smoking counselor).   Get individual, group, or telephone counseling and support. The more counseling you have, the better your chances are of quitting. Programs are available at [employer] and health centers. Call your local health department for information about programs in your area.   Spiritual beliefs and practices may help some smokers quit.   Quit meters are Photographer that keep track of quit statistics, such as amount of "quit-time," cigarettes not smoked, and money saved.   Many smokers find one or more of the many self-help books available useful in helping them quit and stay off tobacco.  3. LEARN NEW SKILLS AND BEHAVIORS  Try to distract yourself from urges to smoke. Talk to someone, go for a walk, or occupy your time with a task.   When you first try to quit, change your routine. Take a different route to work. Drink tea instead of coffee. Eat breakfast in a different place.   Do something to reduce your stress. Take a hot bath, exercise, or read a book.   Plan something enjoyable to do  every day. Reward yourself for not smoking.   Explore interactive web-based programs that specialize in helping you quit.  4. GET MEDICINE AND USE IT CORRECTLY Medicines can help you stop smoking and decrease the urge to smoke. Combining medicine with the above behavioral methods and support can quadruple your chances of successfully quitting smoking. The U.S. Food and Drug Administration (FDA) has approved 7 medicines to help you quit smoking. These medicines fall into 3 categories.  Nicotine replacement therapy (delivers nicotine to your body without the negative effects and risks of smoking):   Nicotine gum: Available over-the-counter.   Nicotine lozenges: Available over-the-counter.   Nicotine inhaler: Available by prescription.   Nicotine nasal spray: Available by prescription.   Nicotine skin patches (transdermal): Available by prescription and over-the-counter.   Antidepressant medicine (helps people abstain from smoking, but how this works is unknown):   Bupropion sustained-release (SR) tablets: Available by prescription.   Nicotinic receptor partial agonist (simulates the effect of nicotine in your brain):   Varenicline tartrate tablets: Available by prescription.   Ask your caregiver for advice about which medicines to use and how to use them. Carefully read the information on the package.   Everyone who is trying to quit may benefit from using a medicine. If you are pregnant or trying to become pregnant, nursing an infant, you are under age 63, or you smoke fewer than 10 cigarettes per day, talk to your caregiver before taking any nicotine replacement medicines.   You should stop using a nicotine replacement product and call your caregiver if you experience nausea, dizziness, weakness, vomiting, fast or irregular heartbeat, mouth problems with the lozenge or gum, or redness or swelling of the skin around the patch that does not go away.   Do not use any other product  containing nicotine while using a nicotine replacement product.   Talk to your caregiver before using these products if you have diabetes, heart disease, asthma, stomach ulcers, you had a recent heart attack, you have high blood pressure that is not controlled with medicine, a history of irregular heartbeat, or you  have been prescribed medicine to help you quit smoking.  5. BE PREPARED FOR RELAPSE OR DIFFICULT SITUATIONS  Most relapses occur within the first 3 months after quitting. Do not be discouraged if you start smoking again. Remember, most people try several times before they finally quit.   You may have symptoms of withdrawal because your body is used to nicotine. You may crave cigarettes, be irritable, feel very hungry, cough often, get headaches, or have difficulty concentrating.   The withdrawal symptoms are only temporary. They are strongest when you first quit, but they will go away within 10 to 14 days.  Here are some difficult situations to watch for:  Alcohol. Avoid drinking alcohol. Drinking lowers your chances of successfully quitting.   Caffeine. Try to reduce the amount of caffeine you consume. It also lowers your chances of successfully quitting.   Other smokers. Being around smoking can make you want to smoke. Avoid smokers.   Weight gain. Many smokers will gain weight when they quit, usually less than 10 pounds. Eat a healthy diet and stay active. Do not let weight gain distract you from your main goal, quitting smoking. Some medicines that help you quit smoking may also help delay weight gain. You can always lose the weight gained after you quit.   Bad mood or depression. There are a lot of ways to improve your mood other than smoking.  If you are having problems with any of these situations, talk to your caregiver. SPECIAL SITUATIONS AND CONDITIONS Studies suggest that everyone can quit smoking. Your situation or condition can give you a special reason to  quit.  Pregnant women/new mothers: By quitting, you protect your baby's health and your own.   Hospitalized patients: By quitting, you reduce health problems and help healing.   Heart attack patients: By quitting, you reduce your risk of a second heart attack.   Lung, head, and neck cancer patients: By quitting, you reduce your chance of a second cancer.   Parents of children and adolescents: By quitting, you protect your children from illnesses caused by secondhand smoke.  QUESTIONS TO THINK ABOUT Think about the following questions before you try to stop smoking. You may want to talk about your answers with your caregiver.  Why do you want to quit?   If you tried to quit in the past, what helped and what did not?   What will be the most difficult situations for you after you quit? How will you plan to handle them?   Who can help you through the tough times? Your family? Friends? Caregiver?   What pleasures do you get from smoking? What ways can you still get pleasure if you quit?  Here are some questions to ask your caregiver:  How can you help me to be successful at quitting?   What medicine do you think would be best for me and how should I take it?   What should I do if I need more help?   What is smoking withdrawal like? How can I get information on withdrawal?  Quitting takes hard work and a lot of effort, but you can quit smoking.

## 2012-05-31 NOTE — Progress Notes (Signed)
Jade Richards 03/15/1970 [id number]        42 y.o.  [id number] for annual exam.  Doing well without complaints.  Past medical history,surgical history, medications, allergies, family history and social history were all reviewed and documented in the EPIC chart. ROS:  Was performed and pertinent positives and negatives are included in the history.  Exam: Kim assistant Filed Vitals:   05/31/12 1450  BP: 120/68  Height: 5\' 4"  (1.626 m)  Weight: 146 lb (66.225 kg)   General appearance  Normal Skin grossly normal Head/Neck normal with no cervical or supraclavicular adenopathy thyroid normal Lungs  clear Cardiac RR, without RMG Abdominal  soft, nontender, without masses, organomegaly or hernia Breasts  examined lying and sitting without masses, retractions, discharge or axillary adenopathy. Pelvic  Ext/BUS/vagina  normal   Cervix  normal Pap/HPV  Uterus  anteverted, normal size, shape and contour, midline and mobile nontender   Adnexa  Without masses or tenderness    Anus and perineum  normal   Rectovaginal  normal sphincter tone without palpated masses or tenderness.    Assessment/Plan:  Jade Richards female for annual exam, regular menses, tubal sterilization.   1. Mammography. Patient's never had mammogram and I have recommended she schedule this as she has turned 41 and she agrees to do so. SBE monthly reviewed. 2. Pap smear 2012. Pap/HPV done today given lack of screening of the past 10 years. Assuming this is negative then we'll plan less frequent screening interval. She does have a history of cryosurgery in the early 1990s with negative Pap smears since. 3. Stop smoking strategies reviewed. 4. Health maintenance. Baseline CBC comprehensive metabolic panel lipid profile urinalysis ordered. Followup in one year, sooner as needed.    Jade Lords MD, 3:29 PM 05/31/2012

## 2012-05-31 NOTE — Addendum Note (Signed)
Addended by: Dayna Barker on: 05/31/2012 03:49 PM   Modules accepted: Orders

## 2012-06-01 LAB — URINALYSIS W MICROSCOPIC + REFLEX CULTURE
Bilirubin Urine: NEGATIVE
Crystals: NONE SEEN
Glucose, UA: NEGATIVE mg/dL
Ketones, ur: NEGATIVE mg/dL
Protein, ur: NEGATIVE mg/dL
Urobilinogen, UA: 0.2 mg/dL (ref 0.0–1.0)

## 2012-06-02 LAB — URINE CULTURE

## 2012-06-03 ENCOUNTER — Other Ambulatory Visit: Payer: Self-pay | Admitting: *Deleted

## 2012-06-03 DIAGNOSIS — E7800 Pure hypercholesterolemia, unspecified: Secondary | ICD-10-CM

## 2012-09-03 ENCOUNTER — Ambulatory Visit (INDEPENDENT_AMBULATORY_CARE_PROVIDER_SITE_OTHER): Payer: BC Managed Care – PPO | Admitting: Family Medicine

## 2012-09-03 ENCOUNTER — Encounter: Payer: Self-pay | Admitting: Family Medicine

## 2012-09-03 VITALS — BP 112/80 | HR 96 | Wt 147.0 lb

## 2012-09-03 DIAGNOSIS — M7711 Lateral epicondylitis, right elbow: Secondary | ICD-10-CM

## 2012-09-03 DIAGNOSIS — M7710 Lateral epicondylitis, unspecified elbow: Secondary | ICD-10-CM

## 2012-09-03 NOTE — Patient Instructions (Signed)
Tennis Elbow Your caregiver has diagnosed you with a condition often referred to as "tennis elbow." This results from small tears or soreness (inflammation) at the start (origin) of the extensor muscles of the forearm. Although the condition is often called tennis or golfer's elbow, it is caused by any repetitive action performed by your elbow. HOME CARE INSTRUCTIONS  If the condition has been short lived, rest may be the only treatment required. Using your opposite hand or arm to perform the task may help. Even changing your grip may help rest the extremity. These may even prevent the condition from recurring.  Longer standing problems, however, will often be relieved faster by:  Using anti-inflammatory agents.  Applying ice packs for 30 minutes at the end of the working day, at bed time, or when activities are finished.  Your caregiver may also have you wear a splint or sling. This will allow the inflamed tendon to heal. At times, steroid injections aided with a local anesthetic will be required along with splinting for 1 to 2 weeks. Two to three steroid injections will often solve the problem. In some long standing cases, the inflamed tendon does not respond to conservative (non-surgical) therapy. Then surgery may be required to repair it. MAKE SURE YOU:   Understand these instructions.  Will watch your condition.  Will get help right away if you are not doing well or get worse. Document Released: 01/02/2005 Document Revised: 03/27/2011 Document Reviewed: 08/21/2007 Do as many things as you can palms up and open. Heat to the area for 20 minutes 3 times per day. You can take 4 Advil 3 times per day [facility] Patient Information [address], [place].

## 2012-09-03 NOTE — Progress Notes (Signed)
  Subjective:    Patient ID: Jade Richards, female    DOB: Feb 18, 1970, 42 y.o.   MRN: [id number]  HPI She has a one-month history of right elbow pain . No history of injury or overuse. 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   Review of Systems     Objective:   Physical Exam Full motion of the elbow. Tender to palpation over the lateral epicondyle. Medial epicondyle normal. No joint swelling is noted.       Assessment & Plan:  Lateral epicondylitis (tennis elbow), right  scuffs the mechanism of the injury and therapy. Recommended heat and anti-inflammatory as well as do as many things as possible pounds up and open. Discussed other there. Again interventions if this does not work.

## 2013-01-30 ENCOUNTER — Encounter: Payer: Self-pay | Admitting: Family Medicine

## 2013-01-30 ENCOUNTER — Ambulatory Visit (INDEPENDENT_AMBULATORY_CARE_PROVIDER_SITE_OTHER): Payer: BC Managed Care – PPO | Admitting: Family Medicine

## 2013-01-30 VITALS — BP 100/64 | HR 88 | Temp 98.5°F | Ht 64.0 in | Wt 153.0 lb

## 2013-01-30 DIAGNOSIS — M545 Low back pain, unspecified: Secondary | ICD-10-CM

## 2013-01-30 LAB — POCT URINALYSIS DIPSTICK
BILIRUBIN UA: NEGATIVE
Glucose, UA: NEGATIVE
Ketones, UA: NEGATIVE
NITRITE UA: NEGATIVE
PH UA: 5
PROTEIN UA: NEGATIVE
Spec Grav, UA: 1.015
UROBILINOGEN UA: NEGATIVE

## 2013-01-30 NOTE — Progress Notes (Signed)
Chief Complaint  Patient presents with  . Back Pain    x 1 week. Also had a question, saw Dr.Lalonde back in August for tennis elbow(right), still having pain and would like to know if there is anything else she can do? Mother had kidner cancer and removal of one kidney, she is overly concerned about this and would like to know if she could have labs done for kidney function.    Gradual onset of lower back pain starting a week ago, across both sides of the lower back.  Pain is constant, gradually getting worse, described as a "lingering pain".  Not related to any activities, worse with certain positions, just constant.  Pain is at bilateral, not central at all.  No radiation into the buttocks or down the legs.  No numbness, tingling or weakness in the legs (just some chronic numbness intermittently in the feet, unchanged x years).  Denies fevers.  Sometimes has a hard time emptying her bladder over the last couple of weeks, needs to "squeeze it out".  Denies dysuria.  Has some urinary urgency, which she relates to her coffee intake.  Denies hematuria.  She has been taking aleve once daily, ongoing since diagnosed with arm tendonitis.  Doesn't seem to help with back pain.  She hasn't tried heat or ice for her back.  Tendonitis R arm.  She took ibuprofen for a while for this, but switched to 1 Aleve/day after having some epigastric pain from ibuprofen.  Past Medical History  Diagnosis Date  . Tobacco abuse     former  . Ganglion cyst     right foot  . Cervical dysplasia early 1990's  . Ovarian cyst   . STD (sexually transmitted disease)     H/O Chlamydia   Past Surgical History  Procedure Laterality Date  . Ovarian cyst removal    . Appendectomy  1989  . Cesarean section      x3  . Tubal ligation    . Colposcopy    . Gynecologic cryosurgery     History   Social History  . Marital Status: Married    Spouse Name: N/A    Number of Children: N/A  . Years of Education: N/A    Occupational History  . nursing assistant    Social History Main Topics  . Smoking status: Former Smoker -- 1.00 packs/day for 19 years    Types: Cigarettes  . Smokeless tobacco: Never Used  . Alcohol Use: 1.2 oz/week    2 Glasses of wine per week     Comment: weekly  . Drug Use: Yes    Special: Marijuana  . Sexual Activity: Yes    Birth Control/ Protection: Surgical     Comment: Tubal lig   Other Topics Concern  . Not on file   Social History Narrative   Married, has 4 children, works as Agricultural engineer at [employer].  Exercise - 1 mile on treadmill and calisthenics, started in September 2012   Current outpatient prescriptions:Multiple Vitamin (MULTIVITAMIN) tablet, Take 1 tablet by mouth daily., Disp: , Rfl: ;  naproxen (NAPROSYN) 250 MG tablet, Take 250 mg by mouth daily., Disp: , Rfl:   No Known Allergies  ROS:  Denies fevers, chills, nausea, vomiting, urinary complaints, rashes, bleeding, bruising, chest pain, headaches, dizziness or other complaints. She denies numbness, tingling, weakness  PHYSICAL EXAM: BP 100/64  Pulse 88  Temp(Src) 98.5 F (36.9 C) (Oral)  Ht 5\' 4"  (1.626 m)  Wt 153 lb (  69.4 kg)  BMI 26.25 kg/m2  LMP 01/06/2013  Well developed, pleasant female in no distress Back: no spine tenderness, no CVA tenderness. Tender over bilateral SI joints.  No muscle tenderness or spasm Neuro: alert and oriented.  DTR's 2+ and symmetric.  Normal strength, sensation, normal SLR Skin: no rash/lesions  Urine dip: trace blood, trace leuks, otherwise normal.  ASSESSMENT/PLAN: LBP (low back pain) - Plan: POCT Urinalysis Dipstick  LBP, at SI joints. No muscle spasm or spine involvement.  Increase the aleve to one pill twice daily with food.  If pain is not improved, and your stomach is tolerating this medication, then increase to 2 pills, twice daily with food.  At that dose, I recommend taking prilosec along with it, just once a day, to help protect  your stomach.  Make sure to take the aleve with food. (risks, side effects and NSAID precautions were reviewed with pt)  Try heat; you can also try topical medications such as biofreeze.  If you have ongoing pain, consider trial of chiropractor--I recommend this especially if you develop pain in the buttocks and more sciatica symptoms  Discussed proper posture, need for core strengthening.  Continue proper lifting.  Reassured re: kidneys--eval for kidney cancer is more based on urine results than blood results.  Can discuss blood screening at next CPE   F/u if symptoms persist, worsen, develops fevers, weakness, or any other ongoing complaints. 25 min visit, more than 1/2 spent counseling.

## 2013-01-30 NOTE — Patient Instructions (Signed)
  Increase the aleve to one pill twice daily with food.  If pain is not improved, and your stomach is tolerating this medication, then increase to 2 pills, twice daily with food.  At that dose, I recommend taking prilosec along with it, just once a day, to help protect your stomach.  Make sure to take the aleve with food.  Try heat; you can also try topical medications such as biofreeze.  If you have ongoing pain, consider trial of chiropractor--I recommend this especially if you develop pain in the buttocks and more sciatica symptoms

## 2013-07-26 IMAGING — CR DG FOOT COMPLETE 3+V*L*
3 series · 3 of 3 positions shown · non-contrast
Comparison: None.

CLINICAL DATA: Pain over the dorsum of the foot, no acute injury

LEFT FOOT - COMPLETE 3+ VIEW

[view not recorded (1 of 3)]
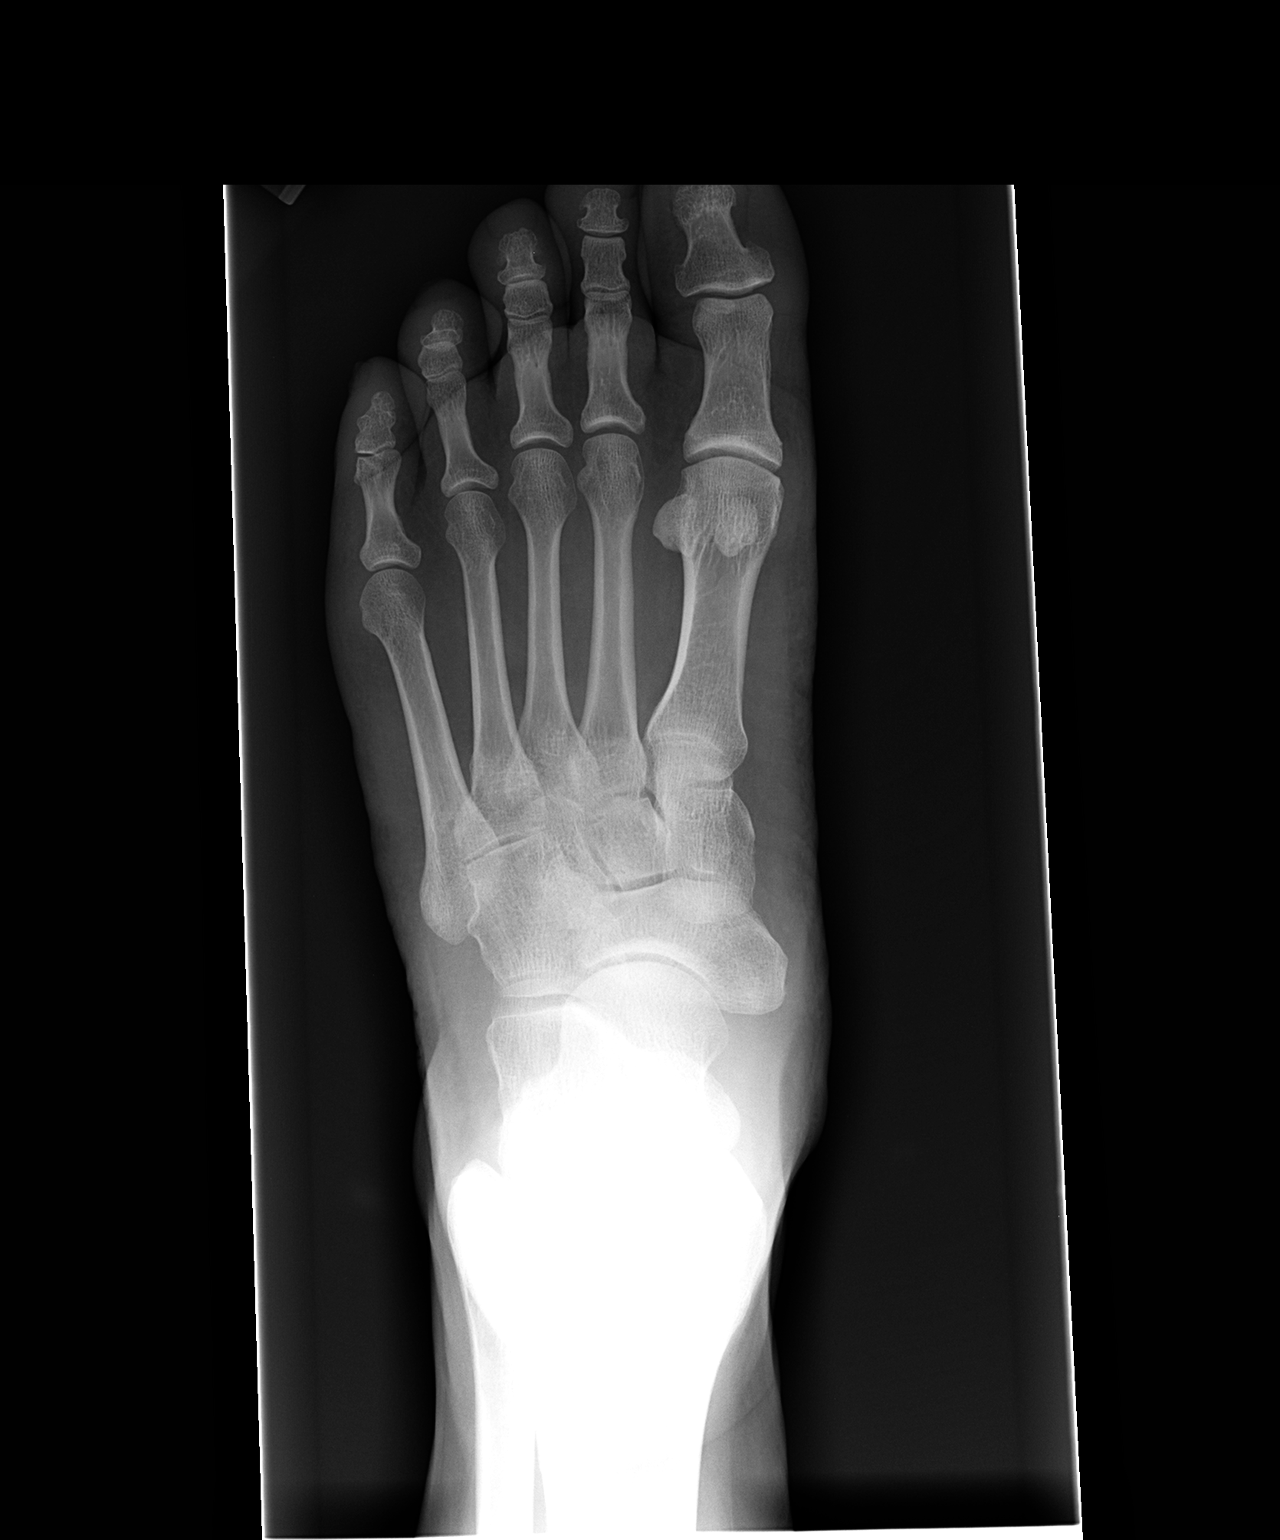

[view not recorded (2 of 3)]
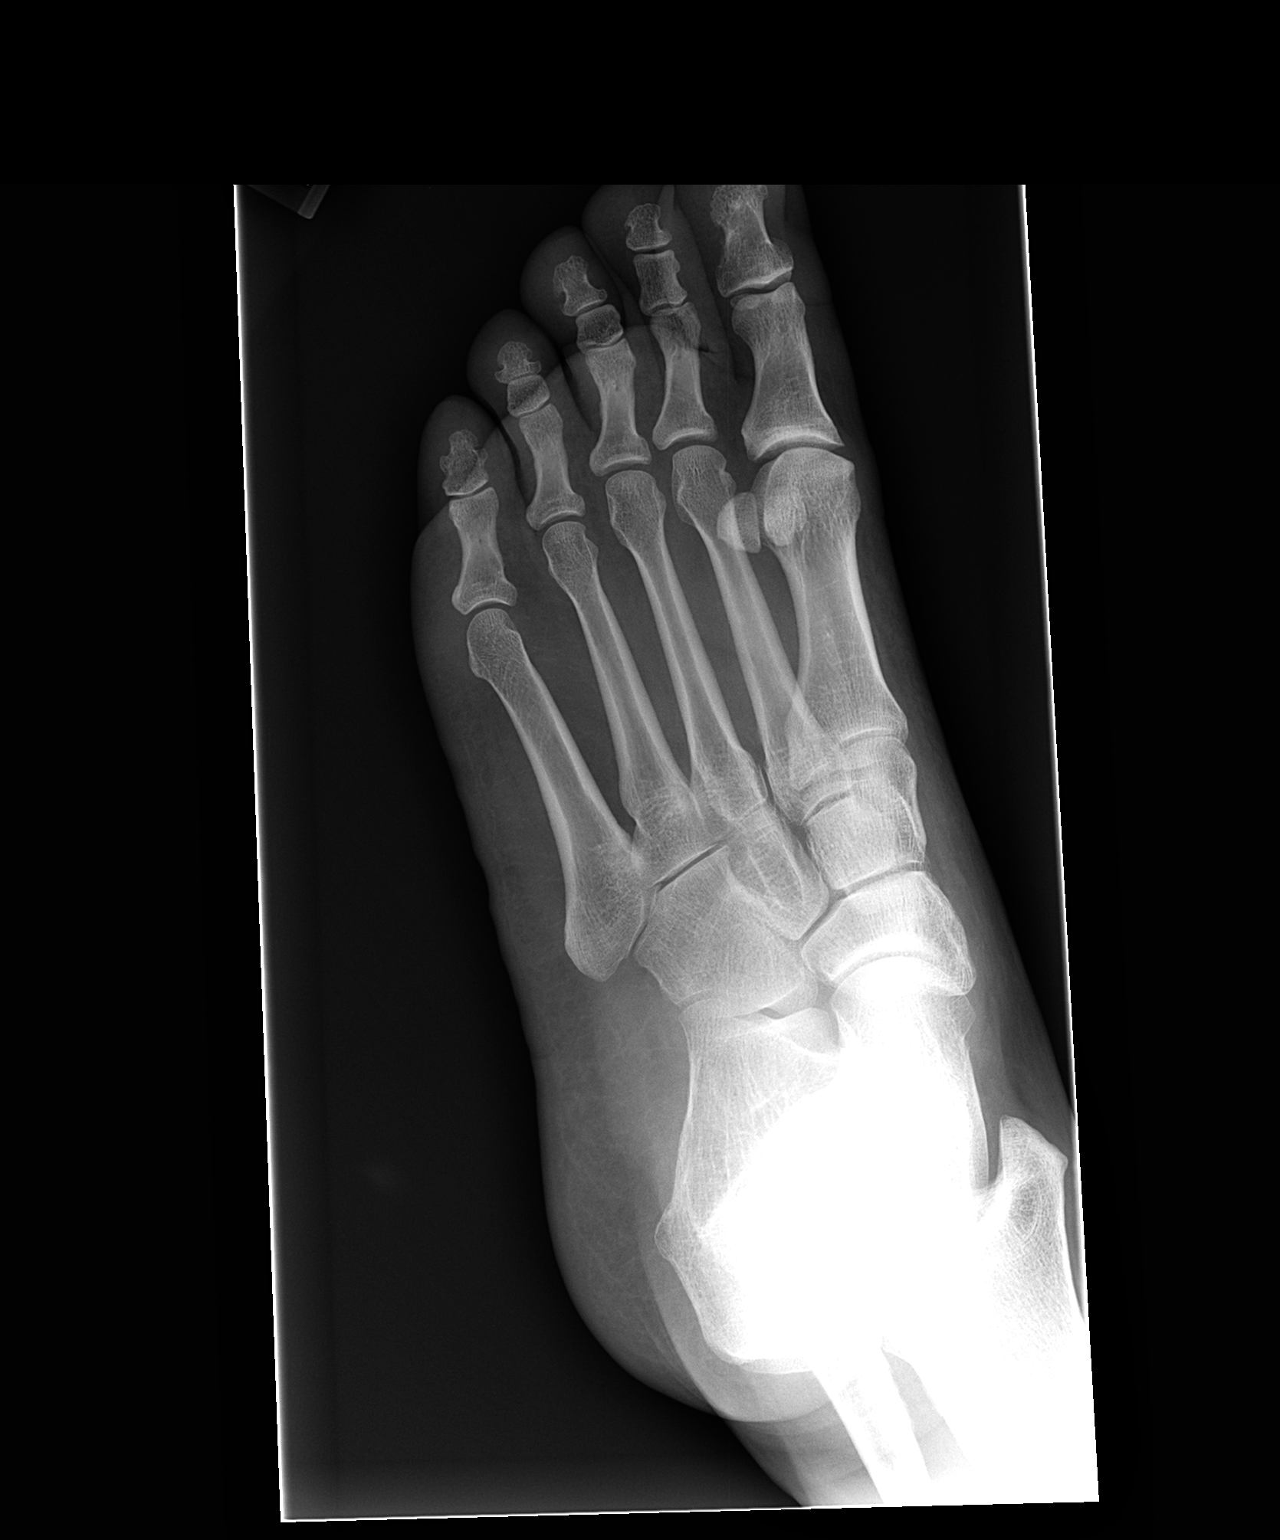

[view not recorded (3 of 3)]
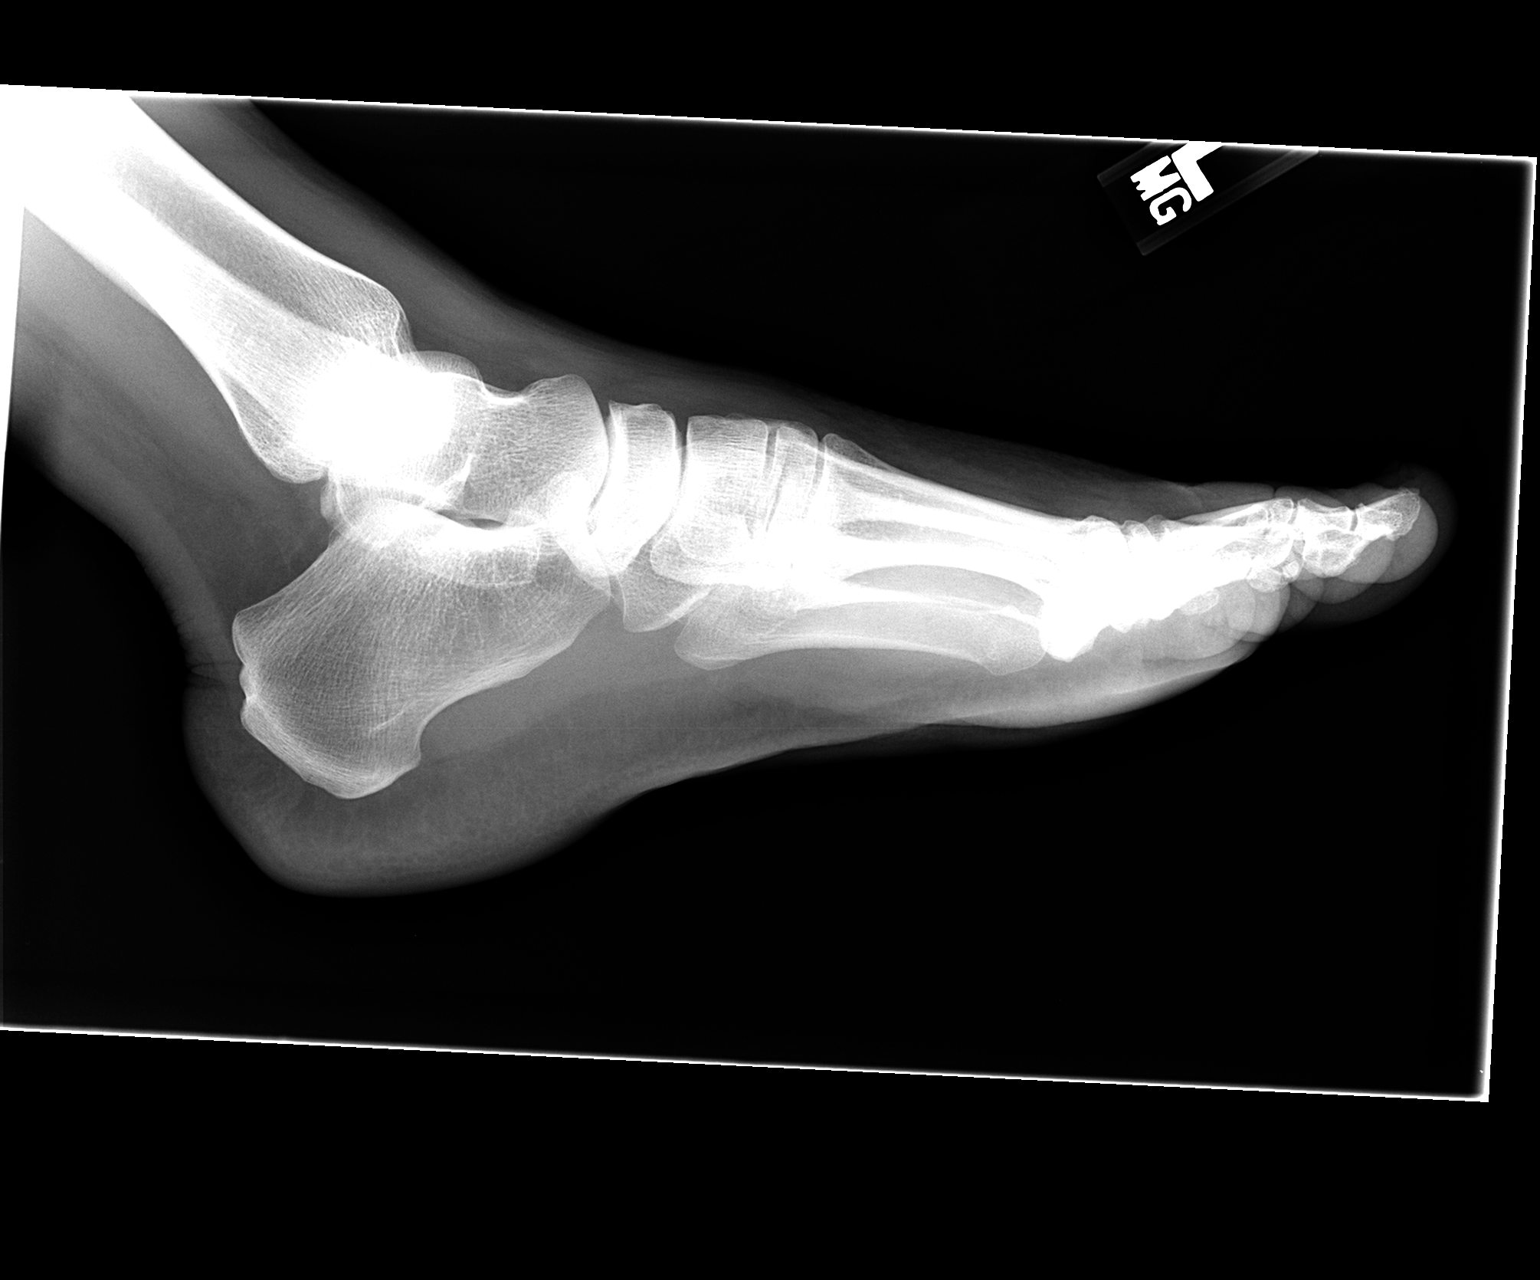

[3 of 3 positions shown; findings below may reference images not displayed]

FINDINGS: Tarsal - metatarsal alignment is normal.  No acute
fracture is seen.  MTP, PIP, and DIP joints appear normal.
IMPRESSION: Negative.

## 2013-10-02 ENCOUNTER — Encounter: Payer: Self-pay | Admitting: Gynecology

## 2013-10-02 ENCOUNTER — Ambulatory Visit (INDEPENDENT_AMBULATORY_CARE_PROVIDER_SITE_OTHER): Payer: BC Managed Care – PPO | Admitting: Gynecology

## 2013-10-02 VITALS — BP 124/74 | Ht 64.0 in | Wt 183.0 lb

## 2013-10-02 DIAGNOSIS — Z01419 Encounter for gynecological examination (general) (routine) without abnormal findings: Secondary | ICD-10-CM

## 2013-10-02 LAB — CBC WITH DIFFERENTIAL/PLATELET
BASOS ABS: 0 10*3/uL (ref 0.0–0.1)
BASOS PCT: 0 % (ref 0–1)
Eosinophils Absolute: 0.1 10*3/uL (ref 0.0–0.7)
Eosinophils Relative: 1 % (ref 0–5)
HCT: 34.5 % — ABNORMAL LOW (ref 36.0–46.0)
HEMOGLOBIN: 12 g/dL (ref 12.0–15.0)
Lymphocytes Relative: 33 % (ref 12–46)
Lymphs Abs: 2.4 10*3/uL (ref 0.7–4.0)
MCH: 30.5 pg (ref 26.0–34.0)
MCHC: 34.8 g/dL (ref 30.0–36.0)
MCV: 87.6 fL (ref 78.0–100.0)
Monocytes Absolute: 0.5 10*3/uL (ref 0.1–1.0)
Monocytes Relative: 7 % (ref 3–12)
NEUTROS ABS: 4.3 10*3/uL (ref 1.7–7.7)
NEUTROS PCT: 59 % (ref 43–77)
PLATELETS: 241 10*3/uL (ref 150–400)
RBC: 3.94 MIL/uL (ref 3.87–5.11)
RDW: 13.2 % (ref 11.5–15.5)
WBC: 7.3 10*3/uL (ref 4.0–10.5)

## 2013-10-02 NOTE — Patient Instructions (Signed)
Call to Schedule your mammogram  Facilities in [place]: 1)  [facility], [address] Phone: [phone] 2)  [facility], [address] Phone: [phone] 3)  Dr[facility] Bertrand at [facility]  [address] Phone: [phone]     Mammogram A mammogram is an X-ray test to find changes in a woman's breast. You should get a mammogram if:  You are 40 years of age or older  You have risk factors.   Your doctor recommends that you have one.  BEFORE THE TEST  Do not schedule the test the week before your period, especially if your breasts are sore during this time.  On the day of your mammogram:  Wash your breasts and armpits well. After washing, do not put on any deodorant or talcum powder on until after your test.   Eat and drink as you usually do.   Take your medicines as usual.   If you are diabetic and take insulin, make sure you:   Eat before coming for your test.   Take your insulin as usual.   If you cannot keep your appointment, call before the appointment to cancel. Schedule another appointment.  TEST  You will need to undress from the waist up. You will put on a hospital gown.   Your breast will be put on the mammogram machine, and it will press firmly on your breast with a piece of plastic called a compression paddle. This will make your breast flatter so that the machine can X-ray all parts of your breast.   Both breasts will be X-rayed. Each breast will be X-rayed from above and from the side. An X-ray might need to be taken again if the picture is not good enough.   The mammogram will last about 15 to 30 minutes.  AFTER THE TEST Finding out the results of your test Ask when your test results will be ready. Make sure you get your test results.  Document Released: 03/31/2008 Document Revised: 12/22/2010 Document Reviewed: 03/31/2008 [employer] Patient  Information 2012 [employer].  You may obtain a copy of any labs that were done today by logging onto MyChart as outlined in the instructions provided with your AVS (after visit summary). The office will not call with normal lab results but certainly if there are any significant abnormalities then we will contact you.   Health Maintenance, Female A healthy lifestyle and preventative care can promote health and wellness.  Maintain regular health, dental, and eye exams.  Eat a healthy diet. Foods like vegetables, fruits, whole grains, low-fat dairy products, and lean protein foods contain the nutrients you need without too many calories. Decrease your intake of foods high in solid fats, added sugars, and salt. Get information about a proper diet from your caregiver, if necessary.  Regular physical exercise is one of the most important things you can do for your health. Most adults should get at least 150 minutes of moderate-intensity exercise (any activity that increases your heart rate and causes you to sweat) each week. In addition, most adults need muscle-strengthening exercises on 2 or more days a week.   Maintain a healthy weight. The body mass index (BMI) is a screening tool to identify possible weight problems. It provides an estimate of body fat based on height and weight. Your caregiver can help determine your BMI, and can help you achieve or maintain a healthy weight. For adults   20 years and older:  A BMI below 18.5 is considered underweight.  A BMI of 18.5 to 24.9 is normal.  A BMI of 25 to 29.9 is considered overweight.  A BMI of 30 and above is considered obese.  Maintain normal blood lipids and cholesterol by exercising and minimizing your intake of saturated fat. Eat a balanced diet with plenty of fruits and vegetables. Blood tests for lipids and cholesterol should begin at age 20 and be repeated every 5 years. If your lipid or cholesterol levels are high, you are over 50, or  you are a high risk for heart disease, you may need your cholesterol levels checked more frequently.Ongoing high lipid and cholesterol levels should be treated with medicines if diet and exercise are not effective.  If you smoke, find out from your caregiver how to quit. If you do not use tobacco, do not start.  Lung cancer screening is recommended for adults aged 55 80 years who are at high risk for developing lung cancer because of a history of smoking. Yearly low-dose computed tomography (CT) is recommended for people who have at least a 30-pack-year history of smoking and are a current smoker or have quit within the past 15 years. A pack year of smoking is smoking an average of 1 pack of cigarettes a day for 1 year (for example: 1 pack a day for 30 years or 2 packs a day for 15 years). Yearly screening should continue until the smoker has stopped smoking for at least 15 years. Yearly screening should also be stopped for people who develop a health problem that would prevent them from having lung cancer treatment.  If you are pregnant, do not drink alcohol. If you are breastfeeding, be very cautious about drinking alcohol. If you are not pregnant and choose to drink alcohol, do not exceed 1 drink per day. One drink is considered to be 12 ounces (355 mL) of beer, 5 ounces (148 mL) of wine, or 1.5 ounces (44 mL) of liquor.  Avoid use of street drugs. Do not share needles with anyone. Ask for help if you need support or instructions about stopping the use of drugs.  High blood pressure causes heart disease and increases the risk of stroke. Blood pressure should be checked at least every 1 to 2 years. Ongoing high blood pressure should be treated with medicines, if weight loss and exercise are not effective.  If you are 55 to 42 years old, ask your caregiver if you should take aspirin to prevent strokes.  Diabetes screening involves taking a blood sample to check your fasting blood sugar level. This  should be done once every 3 years, after age 45, if you are within normal weight and without risk factors for diabetes. Testing should be considered at a younger age or be carried out more frequently if you are overweight and have at least 1 risk factor for diabetes.  Breast cancer screening is essential preventative care for women. You should practice "breast self-awareness." This means understanding the normal appearance and feel of your breasts and may include breast self-examination. Any changes detected, no matter how small, should be reported to a caregiver. Women in their 20s and 30s should have a clinical breast exam (CBE) by a caregiver as part of a regular health exam every 1 to 3 years. After age 40, women should have a CBE every year. Starting at age 40, women should consider having a mammogram (breast X-ray) every year. Women who have a   have a family history of breast cancer should talk to their caregiver about genetic screening. Women at a high risk of breast cancer should talk to their caregiver about having an MRI and a mammogram every year.  Breast cancer gene (BRCA)-related cancer risk assessment is recommended for women who have family members with BRCA-related cancers. BRCA-related cancers include breast, ovarian, tubal, and peritoneal cancers. Having family members with these cancers may be associated with an increased risk for harmful changes (mutations) in the breast cancer genes BRCA1 and BRCA2. Results of the assessment will determine the need for genetic counseling and BRCA1 and BRCA2 testing.  The Pap test is a screening test for cervical cancer. Women should have a Pap test starting at age 21. Between ages 88 and 53, Pap tests should be repeated every 2 years. Beginning at age 6, you should have a Pap test every 3 years as long as the past 3 Pap tests have been normal. If you had a hysterectomy for a problem that was not cancer or a condition that could lead to cancer, then you no longer  need Pap tests. If you are between ages 56 and 67, and you have had normal Pap tests going back 10 years, you no longer need Pap tests. If you have had past treatment for cervical cancer or a condition that could lead to cancer, you need Pap tests and screening for cancer for at least 20 years after your treatment. If Pap tests have been discontinued, risk factors (such as a new sexual partner) need to be reassessed to determine if screening should be resumed. Some women have medical problems that increase the chance of getting cervical cancer. In these cases, your caregiver may recommend more frequent screening and Pap tests.  The human papillomavirus (HPV) test is an additional test that may be used for cervical cancer screening. The HPV test looks for the virus that can cause the cell changes on the cervix. The cells collected during the Pap test can be tested for HPV. The HPV test could be used to screen women aged 82 years and older, and should be used in women of any age who have unclear Pap test results. After the age of 16, women should have HPV testing at the same frequency as a Pap test.  Colorectal cancer can be detected and often prevented. Most routine colorectal cancer screening begins at the age of 45 and continues through age 71. However, your caregiver may recommend screening at an earlier age if you have risk factors for colon cancer. On a yearly basis, your caregiver may provide home test kits to check for hidden blood in the stool. Use of a small camera at the end of a tube, to directly examine the colon (sigmoidoscopy or colonoscopy), can detect the earliest forms of colorectal cancer. Talk to your caregiver about this at age 75, when routine screening begins. Direct examination of the colon should be repeated every 5 to 10 years through age 48, unless early forms of pre-cancerous polyps or small growths are found.  Hepatitis C blood testing is recommended for all people born from [birth year]  through 1965 and any individual with known risks for hepatitis C.  Practice safe sex. Use condoms and avoid high-risk sexual practices to reduce the spread of sexually transmitted infections (STIs). Sexually active women aged 85 and younger should be checked for Chlamydia, which is a common sexually transmitted infection. Older women with new or multiple partners should also be tested for Chlamydia.  Testing for other STIs is recommended if you are sexually active and at increased risk.  Osteoporosis is a disease in which the bones lose minerals and strength with aging. This can result in serious bone fractures. The risk of osteoporosis can be identified using a bone density scan. Women ages 73 and over and women at risk for fractures or osteoporosis should discuss screening with their caregivers. Ask your caregiver whether you should be taking a calcium supplement or vitamin D to reduce the rate of osteoporosis.  Menopause can be associated with physical symptoms and risks. Hormone replacement therapy is available to decrease symptoms and risks. You should talk to your caregiver about whether hormone replacement therapy is right for you.  Use sunscreen. Apply sunscreen liberally and repeatedly throughout the day. You should seek shade when your shadow is shorter than you. Protect yourself by wearing long sleeves, pants, a wide-brimmed hat, and sunglasses year round, whenever you are outdoors.  Notify your caregiver of new moles or changes in moles, especially if there is a change in shape or color. Also notify your caregiver if a mole is larger than the size of a pencil eraser.  Stay current with your immunizations. Document Released: 07/18/2010 Document Revised: 04/29/2012 Document Reviewed: 07/18/2010 [facility] Patient Information 2014 [place].

## 2013-10-02 NOTE — Progress Notes (Signed)
JOLONDA GOMM 08/21/1970 [id number]        43 y.o.  [id number] for annual exam.  Doing well without complaints.  Past medical history,surgical history, problem list, medications, allergies, family history and social history were all reviewed and documented as reviewed in the EPIC chart.  ROS:  12 system ROS performed with pertinent positives and negatives included in the history, assessment and plan.   Additional significant findings :  none   Exam: [facility] person Vitals:   10/02/13 1526  BP: 124/74  Height:  (1.626 m)  Weight: 183 lb (83.008 kg)   General appearance:  Normal affect, orientation and appearance. Skin: Grossly normal HEENT: Without gross lesions.  No cervical or supraclavicular adenopathy. Thyroid normal.  Lungs:  Clear without wheezing, rales or rhonchi Cardiac: RR, without RMG Abdominal:  Soft, nontender, without masses, guarding, rebound, organomegaly or hernia Breasts:  Examined lying and sitting without masses, retractions, discharge or axillary adenopathy. Pelvic:  Ext/BUS/vagina normal  Cervix normal.   Uterus anteverted, normal size, shape and contour, midline and mobile nontender   Adnexa  Without masses or tenderness    Anus and perineum  Normal   Rectovaginal  Normal sphincter tone without palpated masses or tenderness.    Assessment/Plan:  43 y.o. [id number] female for annual exam with regular menses, tubal sterilization.   1. Pap/HPV 05/2012 negative.  No Pap smear done today.  History of cryosurgery early 75s. 2. Mammogram never. Strongly recommended she schedule a screening mammography and she agrees to do so. SBE monthly reviewed. 3. Health maintenance. Baseline CBC comprehensive metabolic panel lipid profile urinalysis ordered. Patient does note that her cholesterol was elevated previously and we'll check it now. Follow up in one year, sooner as needed.     Dara Lords MD, 3:47 PM 10/02/2013

## 2013-10-03 LAB — COMPREHENSIVE METABOLIC PANEL
ALBUMIN: 4.3 g/dL (ref 3.5–5.2)
ALK PHOS: 64 U/L (ref 39–117)
ALT: 24 U/L (ref 0–35)
AST: 18 U/L (ref 0–37)
BUN: 12 mg/dL (ref 6–23)
CO2: 27 mEq/L (ref 19–32)
Calcium: 9.4 mg/dL (ref 8.4–10.5)
Chloride: 103 mEq/L (ref 96–112)
Creat: 0.73 mg/dL (ref 0.50–1.10)
Glucose, Bld: 80 mg/dL (ref 70–99)
POTASSIUM: 3.7 meq/L (ref 3.5–5.3)
SODIUM: 139 meq/L (ref 135–145)
TOTAL PROTEIN: 7.2 g/dL (ref 6.0–8.3)
Total Bilirubin: 0.3 mg/dL (ref 0.2–1.2)

## 2013-10-03 LAB — LIPID PANEL
Cholesterol: 207 mg/dL — ABNORMAL HIGH (ref 0–200)
HDL: 52 mg/dL (ref >39)
LDL CALC: 118 mg/dL — AB (ref 0–99)
Total CHOL/HDL Ratio: 4 Ratio
Triglycerides: 184 mg/dL — ABNORMAL HIGH (ref <150)
VLDL: 37 mg/dL (ref 0–40)

## 2013-10-03 LAB — URINALYSIS W MICROSCOPIC + REFLEX CULTURE
Bilirubin Urine: NEGATIVE
CASTS: NONE SEEN
Crystals: NONE SEEN
Glucose, UA: NEGATIVE mg/dL
HGB URINE DIPSTICK: NEGATIVE
Ketones, ur: NEGATIVE mg/dL
LEUKOCYTES UA: NEGATIVE
NITRITE: NEGATIVE
PH: 7 (ref 5.0–8.0)
Protein, ur: NEGATIVE mg/dL
SPECIFIC GRAVITY, URINE: 1.01 (ref 1.005–1.030)
UROBILINOGEN UA: 0.2 mg/dL (ref 0.0–1.0)

## 2013-10-05 LAB — URINE CULTURE

## 2013-10-06 ENCOUNTER — Other Ambulatory Visit: Payer: Self-pay | Admitting: Gynecology

## 2013-10-06 MED ORDER — SULFAMETHOXAZOLE-TMP DS 800-160 MG PO TABS: 1.0000 | ORAL_TABLET | Freq: Two times a day (BID) | ORAL | Status: DC

## 2013-11-07 ENCOUNTER — Encounter (HOSPITAL_COMMUNITY): Payer: Self-pay | Admitting: Emergency Medicine

## 2013-11-07 ENCOUNTER — Emergency Department (INDEPENDENT_AMBULATORY_CARE_PROVIDER_SITE_OTHER)
Admission: EM | Admit: 2013-11-07 | Discharge: 2013-11-07 | Disposition: A | Discharge status: Home / Self Care | Payer: BC Managed Care – PPO | Source: Home / Self Care | Attending: Family Medicine | Admitting: Family Medicine

## 2013-11-07 DIAGNOSIS — H18892 Other specified disorders of cornea, left eye: Secondary | ICD-10-CM

## 2013-11-07 DIAGNOSIS — H5712 Ocular pain, left eye: Secondary | ICD-10-CM

## 2013-11-07 MED ORDER — TETRACAINE HCL 0.5 % OP SOLN
OPHTHALMIC | Status: AC
  Filled 2013-11-07: qty 2

## 2013-11-07 MED ORDER — TETRACAINE HCL 0.5 % OP SOLN
2.0000 [drp] | Freq: Once | OPHTHALMIC | Status: AC
  Administered 2013-11-07: 2 [drp] via OPHTHALMIC

## 2013-11-07 NOTE — ED Notes (Signed)
Patient is to go directly to Dr. Lucious GrovesGroats office  Upon discharge.  Patient and her husband verbalize understanding.

## 2013-11-07 NOTE — ED Notes (Signed)
43 year old female, who states that at work yesterday her Left eye felt like it had a film over it and was slightly watery.  During the night she woke up with severe pain, swelling and redness to her left eye.  Eye is red, swollen and  Tearing.  Tetracaine 2 gtts applied for a visual acuity.  Acuity is 20/50 OD, 20/? OS....  Says all she sees is a blur...  Acuity 20/40  OU.

## 2013-11-07 NOTE — Discharge Instructions (Signed)
I have discussed your condition with Dr. Dione BoozeGroat , he will see you right now. When you leave our office, his office is down the hall on the right. Please go straight there.

## 2013-11-07 NOTE — ED Provider Notes (Signed)
CSN: [id number]     Arrival date & time 11/07/13  0809 History   First MD Initiated Contact with Patient 11/07/13 0848     Chief Complaint  Patient presents with  . Eye Pain   (Consider location/radiation/quality/duration/timing/severity/associated sxs/prior Treatment) HPI      43 year old female presents complaining of having something stuck in her left eye. Yesterday she had a slight foreign body sensation in the eye. Last night she scratched it and the eyes started watering a lot. Since then she has developed eye pain and also pain into her left superior lateral quadrant of the eye, and in the left superior lateral eyelid. The eye continues to water profusely. No recent travel or sick contacts. No significant past medical history. No history of eye problems. She does not have an eye doctor . She does not think her vision has been affected.  Past Medical History  Diagnosis Date  . Ganglion cyst     right foot  . Cervical dysplasia early 1990's  . Ovarian cyst   . STD (sexually transmitted disease)     H/O Chlamydia   Past Surgical History  Procedure Laterality Date  . Ovarian cyst removal    . Appendectomy  1989  . Cesarean section      x3  . Tubal ligation    . Gynecologic cryosurgery     Family History  Problem Relation Age of Onset  . Cancer Mother     kidney  . Hypertension Mother   . COPD Mother   . Heart disease Mother   . Asthma Sister   . Cancer Maternal Uncle     kidney  . Diabetes Maternal Grandmother   . Stroke Neg Hx    History  Substance Use Topics  . Smoking status: Former Smoker -- 1.00 packs/day for 19 years    Types: Cigarettes  . Smokeless tobacco: Never Used  . Alcohol Use: 1.2 oz/week    2 Glasses of wine per week     Comment: weekly   OB History   Grav Para Term Preterm Abortions TAB SAB Ect Mult Living   5 4 4  1 1    4      Review of Systems  Constitutional: Negative for fever and chills.  HENT: Negative for congestion and sore throat.    Eyes: Positive for photophobia, pain, discharge and redness. Negative for visual disturbance.  Respiratory: Negative for cough.   All other systems reviewed and are negative.   Allergies  Review of patient's allergies indicates no known allergies.  Home Medications   Prior to Admission medications   Medication Sig Start Date End Date Taking? Authorizing Provider  Multiple Vitamin (MULTIVITAMIN) tablet Take 1 tablet by mouth daily.    Historical Provider, MD  naproxen (NAPROSYN) 250 MG tablet Take 250 mg by mouth daily.    Historical Provider, MD  sulfamethoxazole-trimethoprim (BACTRIM DS) 800-160 MG per tablet Take 1 tablet by mouth 2 (two) times daily. 10/06/13   Dara Lordsimothy P Fontaine, MD   BP 127/85  Pulse 88  Temp(Src) 98.4 F (36.9 C) (Oral)  SpO2 99%  LMP 10/11/2013 Physical Exam  Nursing note and vitals reviewed. Constitutional: She is oriented to person, place, and time. Vital signs are normal. She appears well-developed and well-nourished. No distress.  HENT:  Head: Normocephalic and atraumatic.  Eyes:  Left superior lateral portion of the left lid is swollen. Unable to initially perform eye exam because she is unable to open her eye.  On reexamination after the tetracaine drops, she has no obvious abnormalities. PERRLA, EOMI. Pain, she has a large area of fluorescein uptake over the mid cornea, approximately 1/3 of the cornea.  Pulmonary/Chest: Effort normal. No respiratory distress.  Lymphadenopathy:    She has no cervical adenopathy.  Neurological: She is alert and oriented to person, place, and time. She has normal strength. Coordination normal.  Skin: Skin is warm and dry. No rash noted. She is not diaphoretic.  Psychiatric: She has a normal mood and affect. Judgment normal.    ED Course  Procedures (including critical care time) Labs Review Labs Reviewed - No data to display  Imaging Review No results found.   MDM   1. Eye pain, left   2. Corneal  defect, left    Case discussed with Dr. Dione BoozeGroat, ophthalmologist, he will see the patient now, she will go straight there.    Graylon GoodZachary H Marjoria Mancillas, PA-C 11/07/13 [phone]

## 2013-11-15 NOTE — ED Provider Notes (Signed)
Medical screening examination/treatment/procedure(s) were performed by a resident physician or non-physician practitioner and as the supervising physician I was immediately available for consultation/collaboration.  Shelly Flattenavid Charee Tumblin, MD Family Medicine   Ozella Rocksavid J Carlicia Leavens, MD 11/15/13 [id number]

## 2013-11-17 ENCOUNTER — Encounter (HOSPITAL_COMMUNITY): Payer: Self-pay | Admitting: Emergency Medicine

## 2014-01-23 ENCOUNTER — Ambulatory Visit: Payer: Self-pay | Admitting: Medical

## 2014-01-31 ENCOUNTER — Encounter: Payer: Self-pay | Admitting: Medical

## 2014-03-30 ENCOUNTER — Emergency Department (INDEPENDENT_AMBULATORY_CARE_PROVIDER_SITE_OTHER): Payer: BLUE CROSS/BLUE SHIELD

## 2014-03-30 ENCOUNTER — Encounter (HOSPITAL_COMMUNITY): Payer: Self-pay | Admitting: Emergency Medicine

## 2014-03-30 ENCOUNTER — Encounter (HOSPITAL_COMMUNITY): Payer: Self-pay | Admitting: *Deleted

## 2014-03-30 ENCOUNTER — Emergency Department (HOSPITAL_COMMUNITY)
Admission: EM | Admit: 2014-03-30 | Discharge: 2014-03-30 | Disposition: A | Discharge status: Nursing Home (Includes ICF) | Payer: BLUE CROSS/BLUE SHIELD | Source: Home / Self Care | Attending: Family Medicine | Admitting: Family Medicine

## 2014-03-30 DIAGNOSIS — R000 Tachycardia, unspecified: Secondary | ICD-10-CM | POA: Insufficient documentation

## 2014-03-30 DIAGNOSIS — R0600 Dyspnea, unspecified: Secondary | ICD-10-CM

## 2014-03-30 DIAGNOSIS — E876 Hypokalemia: Secondary | ICD-10-CM | POA: Insufficient documentation

## 2014-03-30 DIAGNOSIS — J069 Acute upper respiratory infection, unspecified: Secondary | ICD-10-CM | POA: Insufficient documentation

## 2014-03-30 DIAGNOSIS — Z8619 Personal history of other infectious and parasitic diseases: Secondary | ICD-10-CM | POA: Insufficient documentation

## 2014-03-30 DIAGNOSIS — Z87891 Personal history of nicotine dependence: Secondary | ICD-10-CM | POA: Insufficient documentation

## 2014-03-30 DIAGNOSIS — Z79899 Other long term (current) drug therapy: Secondary | ICD-10-CM | POA: Diagnosis not present

## 2014-03-30 DIAGNOSIS — Z8739 Personal history of other diseases of the musculoskeletal system and connective tissue: Secondary | ICD-10-CM | POA: Insufficient documentation

## 2014-03-30 DIAGNOSIS — Z8742 Personal history of other diseases of the female genital tract: Secondary | ICD-10-CM | POA: Insufficient documentation

## 2014-03-30 DIAGNOSIS — R0602 Shortness of breath: Secondary | ICD-10-CM

## 2014-03-30 LAB — CBC
HEMATOCRIT: 39 % (ref 36.0–46.0)
HEMOGLOBIN: 13 g/dL (ref 12.0–15.0)
MCH: 29.6 pg (ref 26.0–34.0)
MCHC: 33.3 g/dL (ref 30.0–36.0)
MCV: 88.8 fL (ref 78.0–100.0)
Platelets: 184 10*3/uL (ref 150–400)
RBC: 4.39 MIL/uL (ref 3.87–5.11)
RDW: 12.3 % (ref 11.5–15.5)
WBC: 4 10*3/uL (ref 4.0–10.5)

## 2014-03-30 LAB — I-STAT TROPONIN, ED: Troponin i, poc: 0 ng/mL (ref 0.00–0.08)

## 2014-03-30 LAB — BASIC METABOLIC PANEL
Anion gap: 11 (ref 5–15)
BUN: 11 mg/dL (ref 6–23)
CO2: 22 mmol/L (ref 19–32)
Calcium: 9.2 mg/dL (ref 8.4–10.5)
Chloride: 105 mmol/L (ref 96–112)
Creatinine, Ser: 0.85 mg/dL (ref 0.50–1.10)
GFR calc Af Amer: >90 mL/min (ref >90)
GFR calc non Af Amer: 83 mL/min — ABNORMAL LOW (ref >90)
GLUCOSE: 125 mg/dL — AB (ref 70–99)
POTASSIUM: 2.8 mmol/L — AB (ref 3.5–5.1)
SODIUM: 138 mmol/L (ref 135–145)

## 2014-03-30 MED ORDER — METHYLPREDNISOLONE SODIUM SUCC 125 MG IJ SOLR
125.0000 mg | Freq: Once | INTRAMUSCULAR | Status: AC
  Administered 2014-03-30: 125 mg via INTRAMUSCULAR

## 2014-03-30 MED ORDER — IPRATROPIUM BROMIDE 0.02 % IN SOLN
0.5000 mg | Freq: Once | RESPIRATORY_TRACT | Status: AC
  Administered 2014-03-30: 0.5 mg via RESPIRATORY_TRACT

## 2014-03-30 MED ORDER — ALBUTEROL SULFATE (2.5 MG/3ML) 0.083% IN NEBU
5.0000 mg | INHALATION_SOLUTION | Freq: Once | RESPIRATORY_TRACT | Status: AC
  Administered 2014-03-30: 5 mg via RESPIRATORY_TRACT

## 2014-03-30 MED ORDER — ALBUTEROL SULFATE (2.5 MG/3ML) 0.083% IN NEBU
INHALATION_SOLUTION | RESPIRATORY_TRACT | Status: AC
  Filled 2014-03-30: qty 6

## 2014-03-30 MED ORDER — METHYLPREDNISOLONE SODIUM SUCC 125 MG IJ SOLR
INTRAMUSCULAR | Status: AC
  Filled 2014-03-30: qty 2

## 2014-03-30 MED ORDER — IPRATROPIUM BROMIDE 0.02 % IN SOLN
RESPIRATORY_TRACT | Status: AC
  Filled 2014-03-30: qty 2.5

## 2014-03-30 NOTE — ED Notes (Signed)
Daughter left to go get pt.'s husband and bring him back up here.

## 2014-03-30 NOTE — ED Provider Notes (Signed)
CSN: [id number]     Arrival date & time 03/30/14  1915 History   First MD Initiated Contact with Patient 03/30/14 1926     Chief Complaint  Patient presents with  . Wheezing   (Consider location/radiation/quality/duration/timing/severity/associated sxs/prior Treatment) Patient is a 44 y.o. female presenting with shortness of breath. The history is provided by the patient.  Shortness of Breath Severity:  Moderate Onset quality:  Sudden Duration:  8 hours Progression:  Worsening Chronicity:  New Context: URI   Context comment:  Fever onset  on sat 101 this am, cough and sob today, no smoking for 13 mos.  nonprod cough Associated symptoms: cough, fever and wheezing   Associated symptoms: no abdominal pain     Past Medical History  Diagnosis Date  . Ganglion cyst     right foot  . Cervical dysplasia early 1990's  . Ovarian cyst   . STD (sexually transmitted disease)     H/O Chlamydia   Past Surgical History  Procedure Laterality Date  . Ovarian cyst removal    . Appendectomy  1989  . Cesarean section      x3  . Tubal ligation    . Gynecologic cryosurgery     Family History  Problem Relation Age of Onset  . Cancer Mother     kidney  . Hypertension Mother   . COPD Mother   . Heart disease Mother   . Asthma Sister   . Cancer Maternal Uncle     kidney  . Diabetes Maternal Grandmother   . Stroke Neg Hx    History  Substance Use Topics  . Smoking status: Former Smoker -- 1.00 packs/day for 19 years    Types: Cigarettes    Quit date: 01/15/2013  . Smokeless tobacco: Never Used  . Alcohol Use: 1.2 oz/week    2 Glasses of wine per week     Comment: weekly   OB History    Gravida Para Term Preterm AB TAB SAB Ectopic Multiple Living   5 4 4  1 1    4      Review of Systems  Constitutional: Positive for fever.  HENT: Negative.  Negative for congestion, postnasal drip and rhinorrhea.   Respiratory: Positive for cough, shortness of breath and wheezing.    Gastrointestinal: Negative.  Negative for abdominal pain.  Genitourinary: Negative.     Allergies  Review of patient's allergies indicates no known allergies.  Home Medications   Prior to Admission medications   Medication Sig Start Date End Date Taking? Authorizing Provider  Multiple Vitamin (MULTIVITAMIN) tablet Take 1 tablet by mouth daily.    Historical Provider, MD  naproxen (NAPROSYN) 250 MG tablet Take 250 mg by mouth daily.    Historical Provider, MD  sulfamethoxazole-trimethoprim (BACTRIM DS) 800-160 MG per tablet Take 1 tablet by mouth 2 (two) times daily. 10/06/13   Dara Lordsimothy P Fontaine, MD   BP 136/83 mmHg  Temp(Src) 98.9 F (37.2 C) (Oral)  Resp 28  SpO2 100%  LMP 03/30/2014 Physical Exam  Constitutional: She is oriented to person, place, and time. She appears well-developed and well-nourished. She appears distressed.  HENT:  Right Ear: External ear normal.  Left Ear: External ear normal.  Mouth/Throat: Oropharynx is clear and moist.  Eyes: Conjunctivae are normal. Pupils are equal, round, and reactive to light.  Neck: Normal range of motion. Neck supple.  Cardiovascular: Normal rate, regular rhythm, normal heart sounds and intact distal pulses.   Pulmonary/Chest: She is in respiratory distress.  She has no rales.  Musculoskeletal: She exhibits no edema or tenderness.  Neurological: She is alert and oriented to person, place, and time.  Skin: Skin is warm and dry.  Nursing note and vitals reviewed.   ED Course  Procedures (including critical care time) Labs Review Labs Reviewed - No data to display  Imaging Review Dg Chest 2 View  03/30/2014   CLINICAL DATA:  Fever, dry cough  EXAM: CHEST  2 VIEW  COMPARISON:  None.  FINDINGS: Lungs are essentially clear. No focal consolidation. No pleural effusion or pneumothorax.  The heart is normal in size.  Visualized osseous structures are within normal limits.  IMPRESSION: No evidence of acute cardiopulmonary disease.    Electronically Signed   By: Charline Bills M.D.   On: 03/30/2014 20:07   X-rays reviewed and report per radiologist.   MDM   1. Acute dyspnea   2. SOB (shortness of breath)   3. SOB (shortness of breath)   lungs clear after neb but sx continue of dyspnea and tachy. Sent for eval of acute dyspnea, unresp to neb and solumedrol, sx unchanged. cxr wnl.    Linna Hoff, MD 03/30/14 2032

## 2014-03-30 NOTE — ED Notes (Addendum)
Called to triage pt. with SOB. Pt. audibly wheezing and tachypneic.  Pt. Brought to room 1.  Dr. Juventino Slovak met pt. and ordered neb and Solu Medrol IM.  Pt. c/o itch in her throat on Sat. night and then got fever and body aches.  Yesterday fever was 101.8.  She has been Tylenol.  Has a dry cough. Today when she coughs she started wheezing.  Mom admitted yesterday with COPD.  Took Tylenol this AM and @ 1530.

## 2014-03-30 NOTE — ED Notes (Addendum)
[facility] of generalized body aches and shortness of breath onset yesterday, reports a dry cough and fevers, last dose of Tylenol was at 3pm.  [facility]- no wheezing noted at present.  Pt admits to feeling anxious at present.

## 2014-03-31 ENCOUNTER — Emergency Department (HOSPITAL_COMMUNITY)
Admission: EM | Admit: 2014-03-31 | Discharge: 2014-03-31 | Disposition: A | Discharge status: Home / Self Care | Payer: BLUE CROSS/BLUE SHIELD | Attending: Emergency Medicine | Admitting: Emergency Medicine

## 2014-03-31 DIAGNOSIS — E876 Hypokalemia: Secondary | ICD-10-CM

## 2014-03-31 DIAGNOSIS — R52 Pain, unspecified: Secondary | ICD-10-CM

## 2014-03-31 DIAGNOSIS — J069 Acute upper respiratory infection, unspecified: Secondary | ICD-10-CM

## 2014-03-31 MED ORDER — IBUPROFEN 200 MG PO TABS
600.0000 mg | ORAL_TABLET | Freq: Once | ORAL | Status: AC
  Administered 2014-03-31: 600 mg via ORAL
  Filled 2014-03-31: qty 3

## 2014-03-31 MED ORDER — POTASSIUM CHLORIDE CRYS ER 20 MEQ PO TBCR: 20.0000 meq | EXTENDED_RELEASE_TABLET | Freq: Every day | ORAL | Status: DC

## 2014-03-31 MED ORDER — POTASSIUM CHLORIDE CRYS ER 20 MEQ PO TBCR
60.0000 meq | EXTENDED_RELEASE_TABLET | Freq: Once | ORAL | Status: AC
  Administered 2014-03-31: 60 meq via ORAL
  Filled 2014-03-31: qty 3

## 2014-03-31 NOTE — ED Provider Notes (Signed)
CSN: [id number]     Arrival date & time 03/30/14  2046 History  This chart was scribed for Blane OharaJoshua Darren Nodal, MD by Jade Richards, ED Scribe. This patient was seen in room A07C/A07C and the patient's care was started at 1:53 AM.    Chief Complaint  Patient presents with  . Generalized Body Aches  . Shortness of Breath   Patient is a 44 y.o. female presenting with shortness of breath. The history is provided by the patient. No language interpreter was used.  Shortness of Breath Associated symptoms: cough, fever and sore throat   Associated symptoms: no abdominal pain and no vomiting      HPI Comments: Jade Richards is a 44 y.o. female who presents to the Emergency Department complaining ofgeneralized body aches and shortness of breath that began 3 days ago. Pt reports that her symptoms began with a scratchy throat. She reports that shortly after she began having a fever. She also complains of a nonproductive cough and tachycardia. She admits to recent sick contact with daughter who had Norovirus. Pt was sent to ED from Urgent Care. She mentions that [facility] She denies abdominal pain, nausea, vomiting, diarrhea, dysuria, painful leg swelling, or any other symptoms. She also denies recent foreign travel, recent surgery, or past medical history of blood clots.   Past Medical History  Diagnosis Date  . Ganglion cyst     right foot  . Cervical dysplasia early 1990's  . Ovarian cyst   . STD (sexually transmitted disease)     H/O Chlamydia   Past Surgical History  Procedure Laterality Date  . Ovarian cyst removal    . Appendectomy  1989  . Cesarean section      x3  . Tubal ligation    . Gynecologic cryosurgery     Family History  Problem Relation Age of Onset  . Cancer Mother     kidney  . Hypertension Mother   . COPD Mother   . Heart disease Mother   . Asthma Sister   . Cancer Maternal Uncle     kidney  . Diabetes Maternal Grandmother   . Stroke Neg  Hx    History  Substance Use Topics  . Smoking status: Former Smoker -- 1.00 packs/day for 19 years    Types: Cigarettes    Quit date: 01/15/2013  . Smokeless tobacco: Never Used  . Alcohol Use: 1.2 oz/week    2 Glasses of wine per week     Comment: weekly   OB History    Gravida Para Term Preterm AB TAB SAB Ectopic Multiple Living   5 4 4  1 1    4      Review of Systems  Constitutional: Positive for fever.  HENT: Positive for sore throat.   Respiratory: Positive for cough and shortness of breath.   Cardiovascular: Negative for palpitations and leg swelling.       Tachycardia.   Gastrointestinal: Negative for nausea, vomiting, abdominal pain and diarrhea.  Genitourinary: Negative for dysuria.  Musculoskeletal: Positive for myalgias.  All other systems reviewed and are negative.     Allergies  Review of patient's allergies indicates no known allergies.  Home Medications   Prior to Admission medications   Medication Sig Start Date End Date Taking? Authorizing Provider  acetaminophen (TYLENOL) 500 MG tablet Take 500 mg by mouth every 6 (six) hours as needed for mild pain.   Yes Historical Provider, MD  Multiple  Vitamin (MULTIVITAMIN) tablet Take 1 tablet by mouth daily.   Yes Historical Provider, MD  naproxen (NAPROSYN) 250 MG tablet Take 250 mg by mouth daily as needed for mild pain.    Yes Historical Provider, MD  potassium chloride SA (K-DUR,KLOR-CON) 20 MEQ tablet Take 1 tablet (20 mEq total) by mouth daily. 03/31/14   Blane Ohara, MD  sulfamethoxazole-trimethoprim (BACTRIM DS) 800-160 MG per tablet Take 1 tablet by mouth 2 (two) times daily. Patient not taking: Reported on 03/30/2014 10/06/13   Dara Lords, MD   Triage Vitals: BP 122/85 mmHg  Pulse 105  Temp(Src) 97.8 F (36.6 C) (Oral)  Resp 24  Ht  (1.626 m)  Wt 180 lb (81.647 kg)  BMI 30.88 kg/m2  SpO2 100%  LMP 03/30/2014   Physical Exam  Constitutional: She is oriented to person, place, and  time. She appears well-developed and well-nourished. No distress.  HENT:  Head: Normocephalic and atraumatic.  Eyes: Conjunctivae and EOM are normal.  Neck: Neck supple. No tracheal deviation present.  No meningismus.   Cardiovascular: Normal rate, regular rhythm and normal heart sounds.   Pulmonary/Chest: Effort normal and breath sounds normal. No respiratory distress.  Abdominal: Soft. There is no tenderness.  Musculoskeletal: Normal range of motion. She exhibits no edema.  Neurological: She is alert and oriented to person, place, and time.  Skin: Skin is warm and dry.  Psychiatric: She has a normal mood and affect. Her behavior is normal.  Nursing note and vitals reviewed.   ED Course  Procedures (including critical care time)  DIAGNOSTIC STUDIES: Oxygen Saturation is 100% on RA, normal by my interpretation.    COORDINATION OF CARE: 1:57 AM-Discussed treatment plan which includes BMP, Troponin, CBC with pt at bedside and pt agreed to plan.   Labs Review Labs Reviewed  BASIC METABOLIC PANEL - Abnormal; Notable for the following:    Potassium 2.8 (*)    Glucose, Bld 125 (*)    GFR calc non Af Amer 83 (*)    All other components within normal limits  CBC  I-STAT TROPOININ, ED    Imaging Review No results found.   EKG Interpretation   Date/Time:  Monday March 30 2014 20:55:49 EDT Ventricular Rate:  102 PR Interval:  158 QRS Duration: 100 QT Interval:  354 QTC Calculation: 461 R Axis:   78 Text Interpretation:  Sinus tachycardia Otherwise normal ECG No old  tracing to compare Confirmed by OTTER  MD, OLGA ([id number]) on 03/31/2014  12:03:16 AM Also confirmed by Jodi Mourning  MD, Lelan Cush ([id number])  on 03/31/2014  1:35:04 AM      MDM   Final diagnoses:  Hypokalemia  URI (upper respiratory infection)  Body aches   Well-appearing patient with viral-like symptoms. Chest x-ray reviewed no acute findings, blood work showed mild low potassium. EKG no acute findings. EKG reviewed  no acute findings.  Discussed outpatient follow-up and supportive care.  Results and differential diagnosis were discussed with the patient/parent/guardian. Close follow up outpatient was discussed, comfortable with the plan.   Medications  ibuprofen (ADVIL,MOTRIN) tablet 600 mg (600 mg Oral Given 03/31/14 0224)  potassium chloride SA (K-DUR,KLOR-CON) CR tablet 60 mEq (60 mEq Oral Given 03/31/14 0225)    Filed Vitals:   03/30/14 2057 03/31/14 0037 03/31/14 0215 03/31/14 0230  BP: 136/86 122/85 121/75 120/75  Pulse: 103 105 87 88  Temp: 98 F (36.7 C) 97.8 F (36.6 C)    TempSrc: Oral     Resp: 22 24  Height:  (1.626 m)     Weight: 180 lb (81.647 kg)     SpO2: 98% 100% 99% 92%    Final diagnoses:  Hypokalemia  URI (upper respiratory infection)  Body aches      Blane Ohara, MD 04/06/14 [phone]

## 2014-03-31 NOTE — Discharge Instructions (Signed)
If you were given medicines take as directed.  If you are on coumadin or contraceptives realize their levels and effectiveness is altered by many different medicines.  If you have any reaction (rash, tongues swelling, other) to the medicines stop taking and see a physician.   Please follow up as directed and return to the ER or see a physician for new or worsening symptoms.  Thank you. Filed Vitals:   03/30/14 2057 03/31/14 0037  BP: 136/86 122/85  Pulse: 103 105  Temp: 98 F (36.7 C) 97.8 F (36.6 C)  TempSrc: Oral   Resp: 22 24  Height: 5\' 4"  (1.626 m)   Weight: 180 lb (81.647 kg)   SpO2: 98% 100%

## 2014-07-22 ENCOUNTER — Encounter: Payer: Self-pay | Admitting: Medical

## 2014-07-22 ENCOUNTER — Ambulatory Visit (INDEPENDENT_AMBULATORY_CARE_PROVIDER_SITE_OTHER): Payer: BLUE CROSS/BLUE SHIELD | Admitting: Medical

## 2014-07-22 VITALS — BP 120/82 | HR 85 | Wt 184.0 lb

## 2014-07-22 DIAGNOSIS — E876 Hypokalemia: Secondary | ICD-10-CM | POA: Diagnosis not present

## 2014-07-22 DIAGNOSIS — Z87891 Personal history of nicotine dependence: Secondary | ICD-10-CM

## 2014-07-22 DIAGNOSIS — R030 Elevated blood-pressure reading, without diagnosis of hypertension: Secondary | ICD-10-CM | POA: Diagnosis not present

## 2014-07-22 DIAGNOSIS — R609 Edema, unspecified: Secondary | ICD-10-CM

## 2014-07-22 DIAGNOSIS — G4489 Other headache syndrome: Secondary | ICD-10-CM | POA: Diagnosis not present

## 2014-07-22 LAB — COMPREHENSIVE METABOLIC PANEL
ALT: 15 U/L (ref 0–35)
AST: 14 U/L (ref 0–37)
Albumin: 4.1 g/dL (ref 3.5–5.2)
Alkaline Phosphatase: 75 U/L (ref 39–117)
BILIRUBIN TOTAL: 0.3 mg/dL (ref 0.2–1.2)
BUN: 13 mg/dL (ref 6–23)
CALCIUM: 9.2 mg/dL (ref 8.4–10.5)
CO2: 25 meq/L (ref 19–32)
Chloride: 106 mEq/L (ref 96–112)
Creat: 0.78 mg/dL (ref 0.50–1.10)
Glucose, Bld: 84 mg/dL (ref 70–99)
POTASSIUM: 4.4 meq/L (ref 3.5–5.3)
Sodium: 141 mEq/L (ref 135–145)
Total Protein: 7.1 g/dL (ref 6.0–8.3)

## 2014-07-22 LAB — HEMOGLOBIN A1C
HEMOGLOBIN A1C: 5.7 % — AB (ref <5.7)
Mean Plasma Glucose: 117 mg/dL — ABNORMAL HIGH (ref <117)

## 2014-07-22 LAB — TSH: TSH: 0.627 u[IU]/mL (ref 0.350–4.500)

## 2014-07-22 NOTE — Progress Notes (Signed)
Subjective: Been having unusual headaches, unusual woozy feeling in head a few times in the last month.  Had her BP checked at work several times recently, and numbers were 146/102 and similar readings over several days.   Works at [employer].   Otherwise, no symptoms.  No recent URI symptoms, no abdominal pain, no urinary changes, no problems with sleep, work is fine.   She does note husband been having some issues so she feels like she is not getting enough sex, but no other real symptoms or changes in her routine other then these recent headache feeling that weren't even typical headaches.  No dizziness, no numbness, no slurred speech or confusion.   No other aggravating or relieving factors. No other complaint.  Past Medical History  Diagnosis Date  . Ganglion cyst     right foot  . Cervical dysplasia early 1990's  . Ovarian cyst    ROS as in subjective   Objective: BP 120/82 mmHg  Pulse 85  Wt 184 lb (83.462 kg)  General appearance: alert, no distress, WD/WN HEENT: normocephalic, sclerae anicteric, PERRLA, EOMi, nares patent, no discharge or erythema, pharynx normal Oral cavity: MMM, no lesions Neck: supple, no lymphadenopathy, no thyromegaly, no masses, no bruits Heart: RRR, normal S1, S2, no murmurs Lungs: CTA bilaterally, no wheezes, rhonchi, or rales Extremities: no edema, no cyanosis, no clubbing Pulses: 2+ symmetric, upper and lower extremities, normal cap refill Neurological: oriented x 3, CN2-12 intact, strength normal upper extremities and lower extremities, sensation normal throughout, DTRs 2+ throughout, no cerebellar signs, gait normal Psychiatric: normal affect, behavior normal, pleasant    Adult ECG Report  Indication: edema,not feeling well, headaches  Rate: 78 bpm  Rhythm: normal sinus rhythm  QRS Axis: 45 degrees  PR Interval: 182ms  QRS Duration: 104ms  QTc: 449ms  Conduction Disturbances: none  Other Abnormalities: none  Patient's cardiac risk  factors are: none.  EKG comparison: 03/2014  Narrative Interpretation: no acute changes      Assessment: Encounter Diagnoses  Name Primary?  . Elevated blood-pressure reading without diagnosis of hypertension Yes  . Edema   . Former smoker   . Hypokalemia   . Headache syndrome     Plan: Glad to hear she stopped smoking 2014.   Addressed her concerns . etiology unclear.  My BP reading and nurse's are normal, her last few years of BPs normal . She may have had some transient elevation of BPs could be secondary to stress, or diet or sleep.  No major symptoms of sleep apnea, and she reports healthy diet, routine exercise, not a lot of salt intake.  At this point labs today, stress reduction where possible, hydrate well, and we will call with lab results.   Of note, she was seen for SOB in 03/2014 by the ED had hypokalemia but never started this.   F/u pending labs .

## 2015-10-21 ENCOUNTER — Encounter: Payer: BLUE CROSS/BLUE SHIELD | Admitting: Medical

## 2015-12-03 ENCOUNTER — Encounter: Payer: Self-pay | Admitting: Gynecology

## 2015-12-03 ENCOUNTER — Ambulatory Visit (INDEPENDENT_AMBULATORY_CARE_PROVIDER_SITE_OTHER): Payer: BLUE CROSS/BLUE SHIELD | Admitting: Gynecology

## 2015-12-03 VITALS — BP 120/76 | Ht 64.0 in | Wt 182.0 lb

## 2015-12-03 DIAGNOSIS — Z01419 Encounter for gynecological examination (general) (routine) without abnormal findings: Secondary | ICD-10-CM | POA: Diagnosis not present

## 2015-12-03 DIAGNOSIS — Z13220 Encounter for screening for lipoid disorders: Secondary | ICD-10-CM

## 2015-12-03 LAB — COMPREHENSIVE METABOLIC PANEL
ALK PHOS: 71 U/L (ref 33–115)
ALT: 14 U/L (ref 6–29)
AST: 13 U/L (ref 10–30)
Albumin: 4.1 g/dL (ref 3.6–5.1)
BILIRUBIN TOTAL: 0.3 mg/dL (ref 0.2–1.2)
BUN: 13 mg/dL (ref 7–25)
CO2: 24 mmol/L (ref 20–31)
Calcium: 9.3 mg/dL (ref 8.6–10.2)
Chloride: 106 mmol/L (ref 98–110)
Creat: 0.81 mg/dL (ref 0.50–1.10)
GLUCOSE: 87 mg/dL (ref 65–99)
Potassium: 4.6 mmol/L (ref 3.5–5.3)
Sodium: 139 mmol/L (ref 135–146)
Total Protein: 7 g/dL (ref 6.1–8.1)

## 2015-12-03 LAB — LIPID PANEL
CHOL/HDL RATIO: 5.5 ratio — AB (ref <5.0)
Cholesterol: 213 mg/dL — ABNORMAL HIGH (ref <200)
HDL: 39 mg/dL — ABNORMAL LOW (ref >50)
LDL Cholesterol: 141 mg/dL — ABNORMAL HIGH (ref <100)
Triglycerides: 163 mg/dL — ABNORMAL HIGH (ref <150)
VLDL: 33 mg/dL — ABNORMAL HIGH (ref <30)

## 2015-12-03 LAB — CBC WITH DIFFERENTIAL/PLATELET
BASOS PCT: 1 %
Basophils Absolute: 61 cells/uL (ref 0–200)
Eosinophils Absolute: 122 cells/uL (ref 15–500)
Eosinophils Relative: 2 %
HCT: 37.9 % (ref 35.0–45.0)
Hemoglobin: 12.8 g/dL (ref 11.7–15.5)
Lymphocytes Relative: 35 %
Lymphs Abs: 2135 cells/uL (ref 850–3900)
MCH: 30.4 pg (ref 27.0–33.0)
MCHC: 33.8 g/dL (ref 32.0–36.0)
MCV: 90 fL (ref 80.0–100.0)
MONOS PCT: 5 %
MPV: 9.8 fL (ref 7.5–12.5)
Monocytes Absolute: 305 cells/uL (ref 200–950)
Neutro Abs: 3477 cells/uL (ref 1500–7800)
Neutrophils Relative %: 57 %
PLATELETS: 264 10*3/uL (ref 140–400)
RBC: 4.21 MIL/uL (ref 3.80–5.10)
RDW: 13.2 % (ref 11.0–15.0)
WBC: 6.1 10*3/uL (ref 3.8–10.8)

## 2015-12-03 NOTE — Progress Notes (Signed)
    Jade Richards [birth year] [id number]        45 y.o.  [id number]  for annual exam.    Past medical history,surgical history, problem list, medications, allergies, family history and social history were all reviewed and documented as reviewed in the EPIC chart.  ROS:  Performed with pertinent positives and negatives included in the history, assessment and plan.   Additional significant findings :  None   Exam: Jade PortelaKim Richards assistant Vitals:   12/03/15 0917  BP: 120/76  Weight: 182 lb (82.6 kg)  Height: 5\' 4"  (1.626 m)   Body mass index is 31.24 kg/m.  General appearance:  Normal affect, orientation and appearance. Skin: Grossly normal HEENT: Without gross lesions.  No cervical or supraclavicular adenopathy. Thyroid normal.  Lungs:  Clear without wheezing, rales or rhonchi Cardiac: RR, without RMG Abdominal:  Soft, nontender, without masses, guarding, rebound, organomegaly or hernia Breasts:  Examined lying and sitting without masses, retractions, discharge or axillary adenopathy. Pelvic:  Ext, BUS, Vagina normal  Cervix normal  Uterus anteverted, normal size, shape and contour, midline and mobile nontender   Adnexa without masses or tenderness    Anus and perineum normal   Rectovaginal normal sphincter tone without palpated masses or tenderness.    Assessment/Plan:  45 y.o. [id number] female for annual exam with regular menses, tubal sterilization.   1. Pap smear/HPV 2014 negative. No Pap smear done today. History of cryosurgery in the 1990s. Plan repeat Pap smear approaching 5 year interval per current screening guidelines. 2. Mammography never. I again strongly recommended patient schedule a screening mammogram. Benefits of early detection reviewed. Most common cancer in women discussed. Patient agrees to do so. Names and numbers provided. SBE monthly reviewed. 3. Health maintenance. Patient requests baseline labs. She is fasting. CBC, CMP, lipid profile, urinalysis  ordered. Follow up 1 year, sooner as needed.   Jade Richards,Jade Richards P MD, 9:37 AM 12/03/2015

## 2015-12-03 NOTE — Patient Instructions (Signed)
Call to Schedule your mammogram  Facilities in [place]: 1)  [facility]. [facility], [address] Phone: [phone] 2)  Dr. Bertrand at [facility]  [address] Phone: [phone]     Mammogram A mammogram is an X-ray test to find changes in a woman's breast. You should get a mammogram if:  You are 44 years of age or older  You have risk factors.   Your doctor recommends that you have one.  BEFORE THE TEST  Do not schedule the test the week before your period, especially if your breasts are sore during this time.  On the day of your mammogram:  Wash your breasts and armpits well. After washing, do not put on any deodorant or talcum powder on until after your test.   Eat and drink as you usually do.   Take your medicines as usual.   If you are diabetic and take insulin, make sure you:   Eat before coming for your test.   Take your insulin as usual.   If you cannot keep your appointment, call before the appointment to cancel. Schedule another appointment.  TEST  You will need to undress from the waist up. You will put on a hospital gown.   Your breast will be put on the mammogram machine, and it will press firmly on your breast with a piece of plastic called a compression paddle. This will make your breast flatter so that the machine can X-ray all parts of your breast.   Both breasts will be X-rayed. Each breast will be X-rayed from above and from the side. An X-ray might need to be taken again if the picture is not good enough.   The mammogram will last about 15 to 30 minutes.  AFTER THE TEST Finding out the results of your test Ask when your test results will be ready. Make sure you get your test results.  Document Released: 03/31/2008 Document Revised: 12/22/2010 Document Reviewed: 03/31/2008 ExitCare Patient Information 2012 [employer].   

## 2015-12-04 LAB — URINALYSIS W MICROSCOPIC + REFLEX CULTURE
Bacteria, UA: NONE SEEN [HPF]
Bilirubin Urine: NEGATIVE
Casts: NONE SEEN [LPF]
Crystals: NONE SEEN [HPF]
GLUCOSE, UA: NEGATIVE
HGB URINE DIPSTICK: NEGATIVE
Ketones, ur: NEGATIVE
LEUKOCYTES UA: NEGATIVE
Nitrite: NEGATIVE
PH: 5.5 (ref 5.0–8.0)
Protein, ur: NEGATIVE
SQUAMOUS EPITHELIAL / LPF: NONE SEEN [HPF] (ref <=5)
Specific Gravity, Urine: 1.02 (ref 1.001–1.035)
WBC, UA: NONE SEEN WBC/HPF (ref <=5)
YEAST: NONE SEEN [HPF]

## 2015-12-05 LAB — URINE CULTURE: Organism ID, Bacteria: NO GROWTH

## 2016-08-26 IMAGING — DX DG CHEST 2V
2 series · 2 of 2 positions shown · non-contrast
Comparison: None.

CLINICAL DATA: Fever, dry cough

EXAM:
CHEST  2 VIEW

[chest pa]
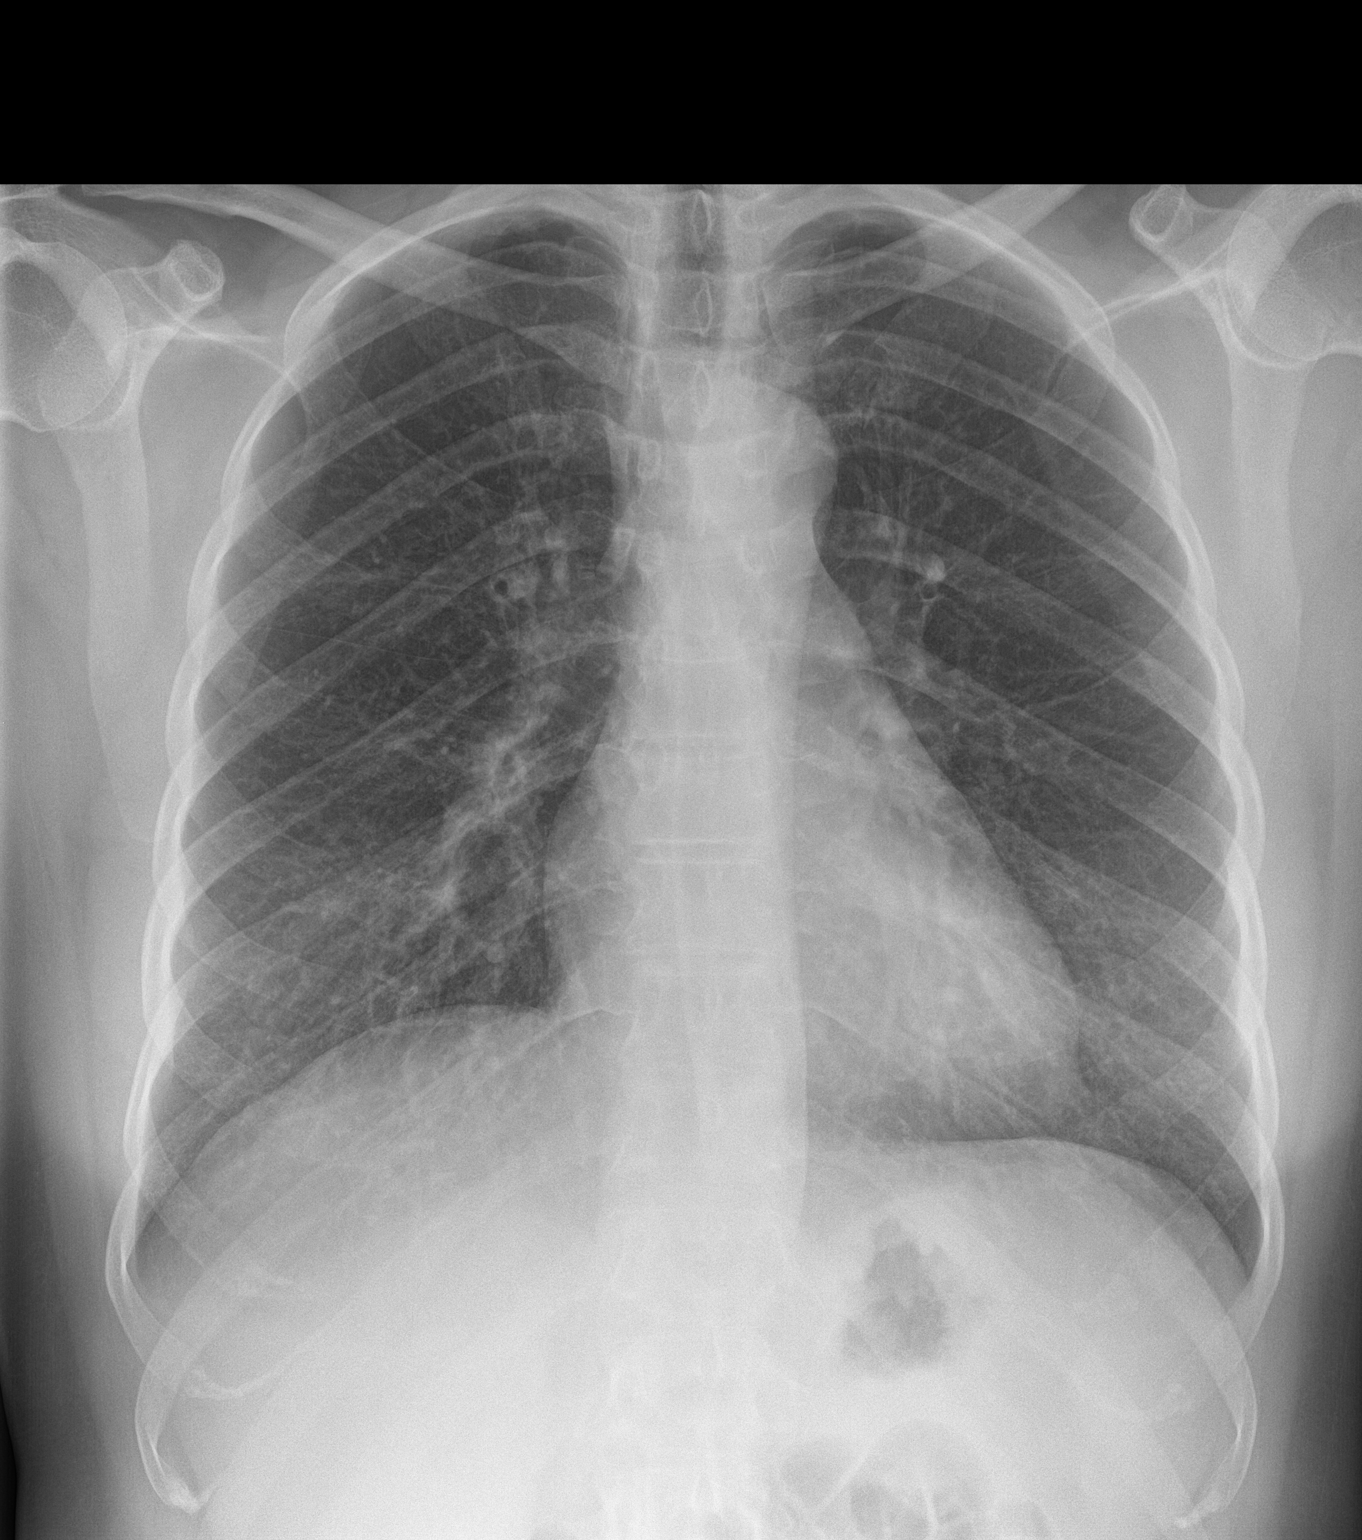

[chest lat]
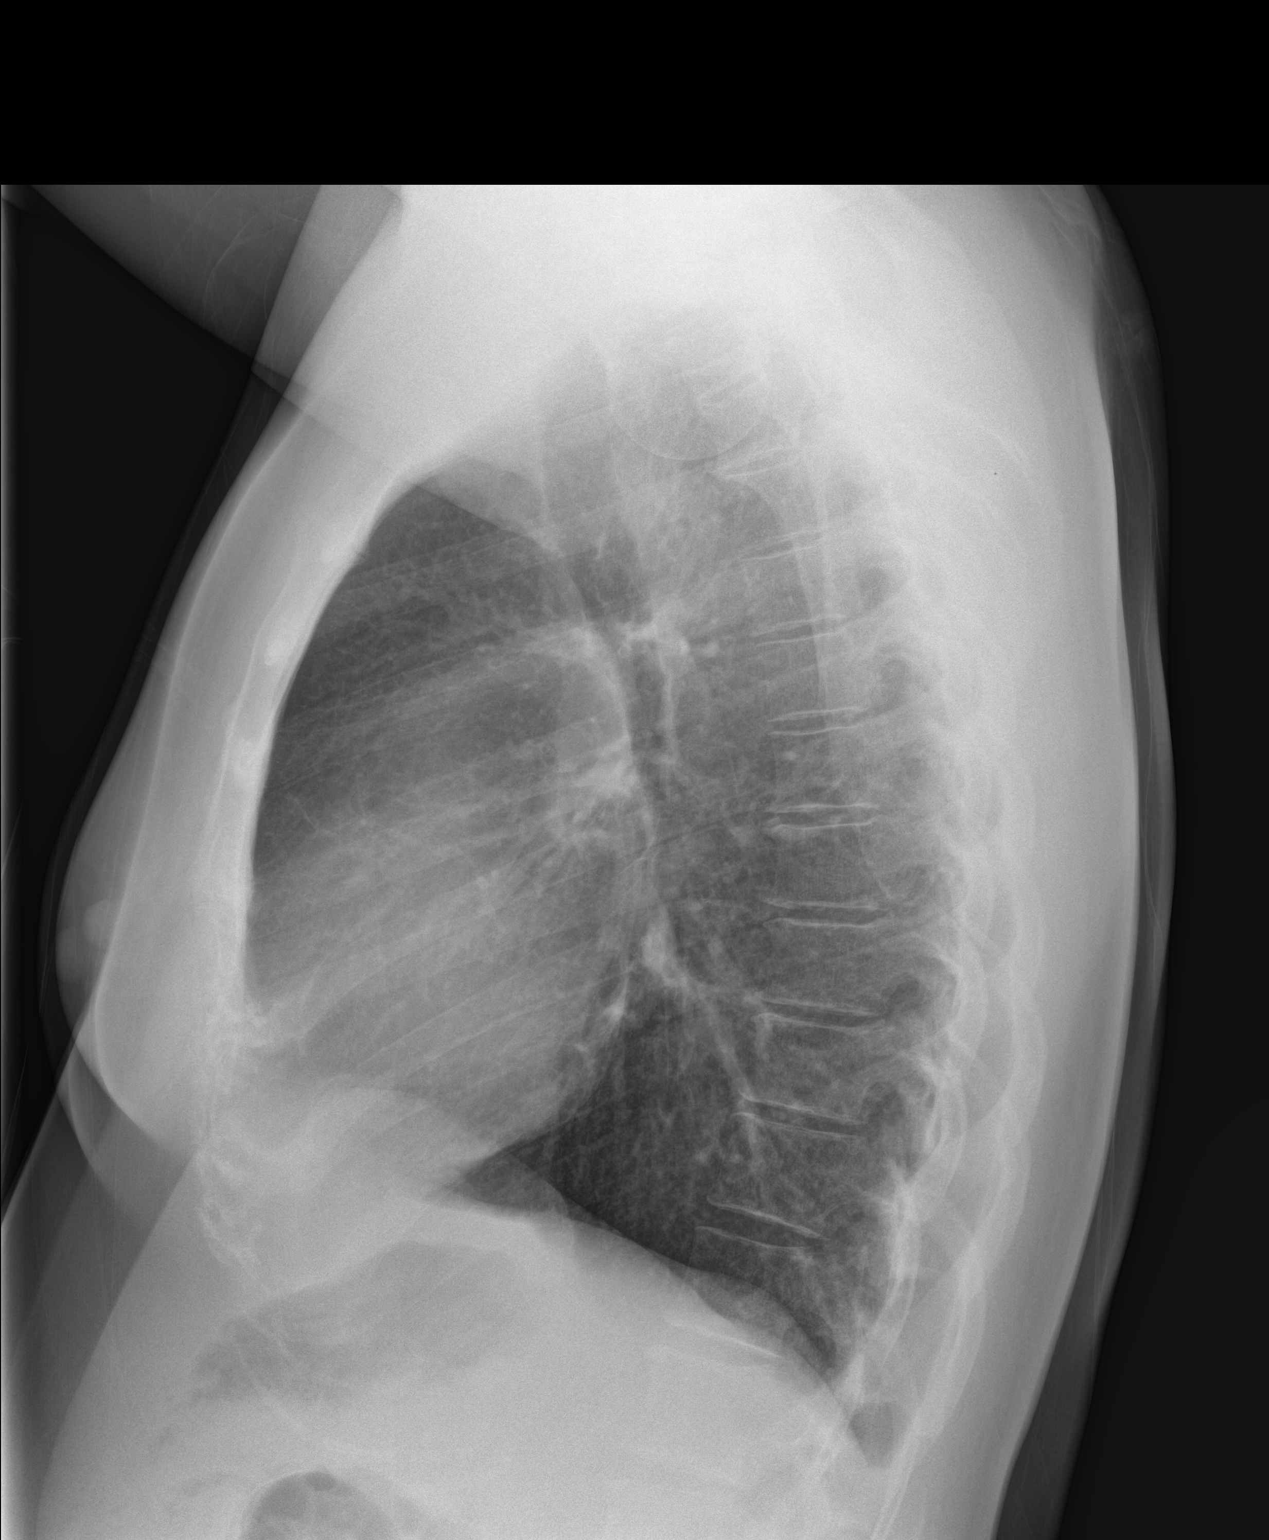

[2 of 2 positions shown; findings below may reference images not displayed]

FINDINGS: Lungs are essentially clear. No focal consolidation. No pleural
effusion or pneumothorax.

The heart is normal in size.

Visualized osseous structures are within normal limits.
IMPRESSION: No evidence of acute cardiopulmonary disease.

## 2016-12-27 ENCOUNTER — Encounter: Payer: BLUE CROSS/BLUE SHIELD | Admitting: Gynecology

## 2017-01-25 ENCOUNTER — Ambulatory Visit (INDEPENDENT_AMBULATORY_CARE_PROVIDER_SITE_OTHER): Payer: BLUE CROSS/BLUE SHIELD | Admitting: Gynecology

## 2017-01-25 ENCOUNTER — Other Ambulatory Visit: Payer: Self-pay | Admitting: *Deleted

## 2017-01-25 ENCOUNTER — Encounter: Payer: Self-pay | Admitting: Gynecology

## 2017-01-25 VITALS — BP 118/78 | Ht 64.0 in | Wt 176.0 lb

## 2017-01-25 DIAGNOSIS — E789 Disorder of lipoprotein metabolism, unspecified: Secondary | ICD-10-CM

## 2017-01-25 DIAGNOSIS — Z1151 Encounter for screening for human papillomavirus (HPV): Secondary | ICD-10-CM

## 2017-01-25 DIAGNOSIS — Z01419 Encounter for gynecological examination (general) (routine) without abnormal findings: Secondary | ICD-10-CM

## 2017-01-25 DIAGNOSIS — E7800 Pure hypercholesterolemia, unspecified: Secondary | ICD-10-CM

## 2017-01-25 DIAGNOSIS — N943 Premenstrual tension syndrome: Secondary | ICD-10-CM | POA: Diagnosis not present

## 2017-01-25 LAB — COMPREHENSIVE METABOLIC PANEL
AG RATIO: 1.6 (calc) (ref 1.0–2.5)
ALBUMIN MSPROF: 4.5 g/dL (ref 3.6–5.1)
ALT: 19 U/L (ref 6–29)
AST: 18 U/L (ref 10–35)
Alkaline phosphatase (APISO): 76 U/L (ref 33–115)
BUN: 15 mg/dL (ref 7–25)
CO2: 28 mmol/L (ref 20–32)
CREATININE: 0.71 mg/dL (ref 0.50–1.10)
Calcium: 9.7 mg/dL (ref 8.6–10.2)
Chloride: 102 mmol/L (ref 98–110)
GLUCOSE: 92 mg/dL (ref 65–99)
Globulin: 2.8 g/dL (calc) (ref 1.9–3.7)
Potassium: 4.1 mmol/L (ref 3.5–5.3)
SODIUM: 136 mmol/L (ref 135–146)
Total Bilirubin: 0.4 mg/dL (ref 0.2–1.2)
Total Protein: 7.3 g/dL (ref 6.1–8.1)

## 2017-01-25 LAB — CBC WITH DIFFERENTIAL/PLATELET
Basophils Absolute: 38 cells/uL (ref 0–200)
Basophils Relative: 0.6 %
EOS ABS: 38 {cells}/uL (ref 15–500)
Eosinophils Relative: 0.6 %
HCT: 37.7 % (ref 35.0–45.0)
HEMOGLOBIN: 13.1 g/dL (ref 11.7–15.5)
Lymphs Abs: 1972 cells/uL (ref 850–3900)
MCH: 30.5 pg (ref 27.0–33.0)
MCHC: 34.7 g/dL (ref 32.0–36.0)
MCV: 87.7 fL (ref 80.0–100.0)
MPV: 10.7 fL (ref 7.5–12.5)
Monocytes Relative: 5.2 %
Neutro Abs: 3925 cells/uL (ref 1500–7800)
Neutrophils Relative %: 62.3 %
Platelets: 243 10*3/uL (ref 140–400)
RBC: 4.3 10*6/uL (ref 3.80–5.10)
RDW: 12.3 % (ref 11.0–15.0)
Total Lymphocyte: 31.3 %
WBC mixed population: 328 cells/uL (ref 200–950)
WBC: 6.3 10*3/uL (ref 3.8–10.8)

## 2017-01-25 LAB — LIPID PANEL
CHOL/HDL RATIO: 5.6 (calc) — AB (ref <5.0)
Cholesterol: 211 mg/dL — ABNORMAL HIGH (ref <200)
HDL: 38 mg/dL — ABNORMAL LOW (ref >50)
LDL Cholesterol (Calc): 147 mg/dL (calc) — ABNORMAL HIGH
Non-HDL Cholesterol (Calc): 173 mg/dL (calc) — ABNORMAL HIGH (ref <130)
Triglycerides: 135 mg/dL (ref <150)

## 2017-01-25 MED ORDER — FLUOXETINE HCL (PMDD) 10 MG PO CAPS: ORAL_CAPSULE | ORAL | 6 refills | Status: DC

## 2017-01-25 NOTE — Patient Instructions (Signed)
Call to Schedule your mammogram  Facilities in [place]: 1)  [facility]. [facility], [address] Phone: [phone] 2)  Dr. Bertrand at [facility]  [address] Phone: [phone]     Mammogram A mammogram is an X-ray test to find changes in a woman's breast. You should get a mammogram if:  You are 46 years of age or older  You have risk factors.   Your doctor recommends that you have one.  BEFORE THE TEST  Do not schedule the test the week before your period, especially if your breasts are sore during this time.  On the day of your mammogram:  Wash your breasts and armpits well. After washing, do not put on any deodorant or talcum powder on until after your test.   Eat and drink as you usually do.   Take your medicines as usual.   If you are diabetic and take insulin, make sure you:   Eat before coming for your test.   Take your insulin as usual.   If you cannot keep your appointment, call before the appointment to cancel. Schedule another appointment.  TEST  You will need to undress from the waist up. You will put on a hospital gown.   Your breast will be put on the mammogram machine, and it will press firmly on your breast with a piece of plastic called a compression paddle. This will make your breast flatter so that the machine can X-ray all parts of your breast.   Both breasts will be X-rayed. Each breast will be X-rayed from above and from the side. An X-ray might need to be taken again if the picture is not good enough.   The mammogram will last about 15 to 30 minutes.  AFTER THE TEST Finding out the results of your test Ask when your test results will be ready. Make sure you get your test results.  Document Released: 03/31/2008 Document Revised: 12/22/2010 Document Reviewed: 03/31/2008 Exit[employer] Patient Information 2012 [employer].   

## 2017-01-25 NOTE — Progress Notes (Signed)
    Jade LatusShalonda L Richards 12-Jun-1970 [id number]        47 y.o.  [id number] for annual gynecologic exam.  The patient notes over the past year worsening premenstrual symptoms that started 2 weeks before her menses with increasing anxiety, tension and anger.  When her period starts the symptoms totally resolve and she feels fine until another 2 weeks when her symptoms return.  She is finding it hard to deal with situations herself and her husband has also noticed these changes.  No depression anxiety type reactions sleep disturbances weight changes or changes in her eating habits.  Past medical history,surgical history, problem list, medications, allergies, family history and social history were all reviewed and documented as reviewed in the EPIC chart.  ROS:  Performed with pertinent positives and negatives included in the history, assessment and plan.   Additional significant findings : None   Exam: Kennon PortelaKim Gardner assistant Vitals:   01/25/17 0752  BP: 118/78  Weight: 176 lb (79.8 kg)  Height: 5\' 4"  (1.626 m)   Body mass index is 30.21 kg/m.  General appearance:  Normal affect, orientation and appearance. Skin: Grossly normal HEENT: Without gross lesions.  No cervical or supraclavicular adenopathy. Thyroid normal.  Lungs:  Clear without wheezing, rales or rhonchi Cardiac: RR, without RMG Abdominal:  Soft, nontender, without masses, guarding, rebound, organomegaly or hernia Breasts:  Examined lying and sitting without masses, retractions, discharge or axillary adenopathy. Pelvic:  Ext, BUS, Vagina: Normal  Cervix: Normal.  Pap smear/HPV  Uterus: Anteverted, normal size, shape and contour, midline and mobile nontender   Adnexa: Without masses or tenderness    Anus and perineum: Normal   Rectovaginal: Normal sphincter tone without palpated masses or tenderness.    Assessment/Plan:  47 y.o. [id number] female for annual gynecologic exam with regular menses, tubal sterilization.   1. PMDD.   Patient with classic description of PMDD.  Symptoms seem to be worsening which I reassured her is not unusual in the 40s.  Options for management reviewed to include behavior modification as well as medications.  Sarafem and its generic fluoxetine options were reviewed.  Side effect profile and risks discussed.  Options for starting 2 weeks before her menses versus daily throughout the month also reviewed.  Patient prefers generic trial and taking 2 weeks before her menses.  Will initiate with 10 mg.  #30 with 6 refills provided.  Patient will call if she notices any side effects or has worsening anxiety/depression.  She will call if she does not feel that the dose is adequate to control her symptoms. 2. Mammogram never.  I again recommended a screening mammogram.  Most common cancer in women as well as benefits of early detection reviewed.  Patient promises to schedule this year.  Names and numbers provided. 3. Pap smear/HPV 2014.  Pap smear HPV done today.  History of cryosurgery in 1990 with normal Pap smears since then.  We will continue with every 5-year screening with Pap smear/HPV per current screening guidelines. 4. Health maintenance.  Patient requests baseline labs.  Borderline cholesterol last year.  Will check CBC, CMP and lipid profile.  Follow-up in 1 year, sooner as needed.  Additional time in excess of her routine gynecologic exam was spent in direct face to face counseling and coordination of care in regards to her PMDD with medication provided.    Dara Lordsimothy P Tishanna Dunford MD, 8:17 AM 01/25/2017

## 2017-01-25 NOTE — Addendum Note (Signed)
Addended by: Dayna BarkerGARDNER, Errin Whitelaw K on: 01/25/2017 08:54 AM   Modules accepted: Orders

## 2017-01-29 LAB — PAP IG AND HPV HIGH-RISK: HPV DNA High Risk: NOT DETECTED

## 2017-02-16 ENCOUNTER — Other Ambulatory Visit: Payer: Self-pay | Admitting: Gynecology

## 2017-02-16 DIAGNOSIS — Z1231 Encounter for screening mammogram for malignant neoplasm of breast: Secondary | ICD-10-CM

## 2017-03-07 ENCOUNTER — Other Ambulatory Visit: Payer: Self-pay | Admitting: Gynecology

## 2017-03-07 ENCOUNTER — Ambulatory Visit
Admission: RE | Admit: 2017-03-07 | Discharge: 2017-03-07 | Disposition: A | Discharge status: Home / Self Care | Payer: BLUE CROSS/BLUE SHIELD | Source: Ambulatory Visit | Attending: Gynecology | Admitting: Gynecology

## 2017-03-07 DIAGNOSIS — Z1231 Encounter for screening mammogram for malignant neoplasm of breast: Secondary | ICD-10-CM

## 2017-03-08 ENCOUNTER — Other Ambulatory Visit: Payer: Self-pay | Admitting: Gynecology

## 2017-03-08 DIAGNOSIS — R928 Other abnormal and inconclusive findings on diagnostic imaging of breast: Secondary | ICD-10-CM

## 2017-03-12 ENCOUNTER — Ambulatory Visit
Admission: RE | Admit: 2017-03-12 | Discharge: 2017-03-12 | Disposition: A | Discharge status: Home / Self Care | Payer: BLUE CROSS/BLUE SHIELD | Source: Ambulatory Visit | Attending: Gynecology | Admitting: Gynecology

## 2017-03-12 ENCOUNTER — Other Ambulatory Visit: Payer: Self-pay | Admitting: Gynecology

## 2017-03-12 DIAGNOSIS — R928 Other abnormal and inconclusive findings on diagnostic imaging of breast: Secondary | ICD-10-CM

## 2017-03-12 DIAGNOSIS — N6489 Other specified disorders of breast: Secondary | ICD-10-CM | POA: Diagnosis not present

## 2017-03-12 DIAGNOSIS — N6320 Unspecified lump in the left breast, unspecified quadrant: Secondary | ICD-10-CM

## 2017-03-12 DIAGNOSIS — R922 Inconclusive mammogram: Secondary | ICD-10-CM | POA: Diagnosis not present

## 2017-06-03 ENCOUNTER — Other Ambulatory Visit: Payer: Self-pay | Admitting: Gynecology

## 2017-09-10 ENCOUNTER — Other Ambulatory Visit: Payer: BLUE CROSS/BLUE SHIELD

## 2017-10-29 ENCOUNTER — Other Ambulatory Visit: Payer: Self-pay | Admitting: Gynecology

## 2017-11-01 ENCOUNTER — Telehealth: Payer: Self-pay

## 2017-11-01 MED ORDER — FLUOXETINE HCL 10 MG PO CAPS: ORAL_CAPSULE | ORAL | 0 refills | Status: DC

## 2017-11-01 NOTE — Telephone Encounter (Signed)
Pharmacy sent an email asking [place] to clarify directions. Revised directions and resent Rx.

## 2017-11-25 ENCOUNTER — Other Ambulatory Visit: Payer: Self-pay | Admitting: Gynecology

## 2018-01-29 ENCOUNTER — Ambulatory Visit (INDEPENDENT_AMBULATORY_CARE_PROVIDER_SITE_OTHER): Payer: BLUE CROSS/BLUE SHIELD | Admitting: Gynecology

## 2018-01-29 ENCOUNTER — Encounter: Payer: Self-pay | Admitting: Gynecology

## 2018-01-29 VITALS — BP 118/76 | Ht 64.0 in | Wt 181.0 lb

## 2018-01-29 DIAGNOSIS — F3281 Premenstrual dysphoric disorder: Secondary | ICD-10-CM

## 2018-01-29 DIAGNOSIS — Z01419 Encounter for gynecological examination (general) (routine) without abnormal findings: Secondary | ICD-10-CM

## 2018-01-29 DIAGNOSIS — Z13220 Encounter for screening for lipoid disorders: Secondary | ICD-10-CM | POA: Diagnosis not present

## 2018-01-29 LAB — COMPREHENSIVE METABOLIC PANEL
AG Ratio: 1.5 (calc) (ref 1.0–2.5)
ALBUMIN MSPROF: 4.3 g/dL (ref 3.6–5.1)
ALT: 11 U/L (ref 6–29)
AST: 13 U/L (ref 10–35)
Alkaline phosphatase (APISO): 67 U/L (ref 33–115)
BUN: 13 mg/dL (ref 7–25)
CHLORIDE: 102 mmol/L (ref 98–110)
CO2: 26 mmol/L (ref 20–32)
Calcium: 9.4 mg/dL (ref 8.6–10.2)
Creat: 0.93 mg/dL (ref 0.50–1.10)
GLOBULIN: 2.8 g/dL (ref 1.9–3.7)
GLUCOSE: 74 mg/dL (ref 65–99)
POTASSIUM: 4.4 mmol/L (ref 3.5–5.3)
SODIUM: 139 mmol/L (ref 135–146)
TOTAL PROTEIN: 7.1 g/dL (ref 6.1–8.1)
Total Bilirubin: 0.4 mg/dL (ref 0.2–1.2)

## 2018-01-29 LAB — CBC WITH DIFFERENTIAL/PLATELET
Absolute Monocytes: 348 cells/uL (ref 200–950)
BASOS PCT: 0.7 %
Basophils Absolute: 41 cells/uL (ref 0–200)
EOS ABS: 47 {cells}/uL (ref 15–500)
EOS PCT: 0.8 %
HCT: 37.8 % (ref 35.0–45.0)
HEMOGLOBIN: 12.9 g/dL (ref 11.7–15.5)
Lymphs Abs: 1971 cells/uL (ref 850–3900)
MCH: 30.5 pg (ref 27.0–33.0)
MCHC: 34.1 g/dL (ref 32.0–36.0)
MCV: 89.4 fL (ref 80.0–100.0)
MONOS PCT: 5.9 %
MPV: 10.6 fL (ref 7.5–12.5)
NEUTROS ABS: 3493 {cells}/uL (ref 1500–7800)
Neutrophils Relative %: 59.2 %
Platelets: 255 10*3/uL (ref 140–400)
RBC: 4.23 10*6/uL (ref 3.80–5.10)
RDW: 11.9 % (ref 11.0–15.0)
Total Lymphocyte: 33.4 %
WBC: 5.9 10*3/uL (ref 3.8–10.8)

## 2018-01-29 LAB — LIPID PANEL
CHOL/HDL RATIO: 5.1 (calc) — AB (ref <5.0)
Cholesterol: 223 mg/dL — ABNORMAL HIGH (ref <200)
HDL: 44 mg/dL — ABNORMAL LOW (ref >50)
LDL Cholesterol (Calc): 149 mg/dL (calc) — ABNORMAL HIGH
NON-HDL CHOLESTEROL (CALC): 179 mg/dL — AB (ref <130)
Triglycerides: 166 mg/dL — ABNORMAL HIGH (ref <150)

## 2018-01-29 MED ORDER — FLUOXETINE HCL 10 MG PO CAPS: ORAL_CAPSULE | ORAL | 4 refills | Status: DC

## 2018-01-29 NOTE — Progress Notes (Signed)
    Jade Richards 11/06/1970 [id number]        48 y.o.  [id number] for annual gynecologic exam.  Without gynecologic complaints  Past medical history,surgical history, problem list, medications, allergies, family history and social history were all reviewed and documented as reviewed in the EPIC chart.  ROS:  Performed with pertinent positives and negatives included in the history, assessment and plan.   Additional significant findings : None   Exam: Jade Richards assistant Vitals:   01/29/18 0826  BP: 118/76  Weight: 181 lb (82.1 kg)  Height: 5\' 4"  (1.626 m)   Body mass index is 31.07 kg/m.  General appearance:  Normal affect, orientation and appearance. Skin: Grossly normal HEENT: Without gross lesions.  No cervical or supraclavicular adenopathy. Thyroid normal.  Lungs:  Clear without wheezing, rales or rhonchi Cardiac: RR, without RMG Abdominal:  Soft, nontender, without masses, guarding, rebound, organomegaly or hernia Breasts:  Examined lying and sitting without masses, retractions, discharge or axillary adenopathy. Pelvic:  Ext, BUS, Vagina: Normal  Cervix: Normal  Uterus: Anteverted, normal size, shape and contour, midline and mobile nontender   Adnexa: Without masses or tenderness    Anus and perineum: Normal   Rectovaginal: Normal sphincter tone without palpated masses or tenderness.    Assessment/Plan:  48 y.o. [id number] female for annual gynecologic exam with regular menses, tubal sterilization.   1. PMDD.  Started fluoxetine 10 mg last year.  Takes it daily.  Has done great with this and wants to continue.  Refill x1 year provided. 2. Pap smear/HPV 01/2017.  No Pap smear done today.  History of cryosurgery 1990 with normal Pap smears since.  Plan repeat Pap smear/HPV at 5-year interval per current screening guidelines. 3. Mammogram 02/2017.  Had follow-up views with recommendation to repeat at 6 months but apparently patient did not follow-up for this.  She is  coming due for her annual now and have strongly recommended her to follow-up for this.  Breast exam normal today. 4. Health maintenance.  Requests baseline labs.  CBC, CMP and lipid profile ordered.  Follow-up 1 year, sooner as needed.   Dara Lords MD, 8:51 AM 01/29/2018

## 2018-01-29 NOTE — Patient Instructions (Signed)
Schedule your mammogram coming due  Follow-up in 1 year for annual exam

## 2018-01-30 ENCOUNTER — Encounter: Payer: Self-pay | Admitting: Gynecology

## 2018-02-13 ENCOUNTER — Other Ambulatory Visit: Payer: Self-pay | Admitting: *Deleted

## 2018-02-13 DIAGNOSIS — E782 Mixed hyperlipidemia: Secondary | ICD-10-CM

## 2018-07-24 ENCOUNTER — Other Ambulatory Visit: Payer: Self-pay | Admitting: *Deleted

## 2018-07-24 DIAGNOSIS — R6889 Other general symptoms and signs: Secondary | ICD-10-CM | POA: Diagnosis not present

## 2018-07-24 DIAGNOSIS — Z20822 Contact with and (suspected) exposure to covid-19: Secondary | ICD-10-CM

## 2018-07-24 NOTE — Progress Notes (Signed)
[id number] 

## 2018-07-29 LAB — NOVEL CORONAVIRUS, NAA: SARS-CoV-2, NAA: NOT DETECTED

## 2018-10-08 ENCOUNTER — Encounter: Payer: Self-pay | Admitting: Gynecology

## 2019-02-03 ENCOUNTER — Encounter: Payer: BLUE CROSS/BLUE SHIELD | Admitting: Gynecology

## 2019-02-13 ENCOUNTER — Other Ambulatory Visit: Payer: Self-pay

## 2019-02-14 ENCOUNTER — Ambulatory Visit (INDEPENDENT_AMBULATORY_CARE_PROVIDER_SITE_OTHER): Payer: BC Managed Care – PPO | Admitting: Obstetrics and Gynecology

## 2019-02-14 ENCOUNTER — Encounter: Payer: Self-pay | Admitting: Obstetrics and Gynecology

## 2019-02-14 VITALS — BP 118/76 | Ht 64.0 in | Wt 168.0 lb

## 2019-02-14 DIAGNOSIS — Z01419 Encounter for gynecological examination (general) (routine) without abnormal findings: Secondary | ICD-10-CM

## 2019-02-14 DIAGNOSIS — R350 Frequency of micturition: Secondary | ICD-10-CM | POA: Diagnosis not present

## 2019-02-14 LAB — LIPID PANEL
Cholesterol: 224 mg/dL — ABNORMAL HIGH (ref <200)
HDL: 46 mg/dL — ABNORMAL LOW (ref >=50)
LDL Cholesterol (Calc): 151 mg/dL (calc) — ABNORMAL HIGH
Non-HDL Cholesterol (Calc): 178 mg/dL (calc) — ABNORMAL HIGH (ref <130)
Total CHOL/HDL Ratio: 4.9 (calc) (ref <5.0)
Triglycerides: 148 mg/dL (ref <150)

## 2019-02-14 LAB — URINALYSIS, COMPLETE W/RFL CULTURE
Bacteria, UA: NONE SEEN /HPF
Bilirubin Urine: NEGATIVE
Glucose, UA: NEGATIVE
Hyaline Cast: NONE SEEN /LPF
Ketones, ur: NEGATIVE
Leukocyte Esterase: NEGATIVE
Nitrites, Initial: NEGATIVE
Protein, ur: NEGATIVE
RBC / HPF: NONE SEEN /HPF (ref 0–2)
Specific Gravity, Urine: 1.025 (ref 1.001–1.03)
WBC, UA: NONE SEEN /HPF (ref 0–5)
pH: 6.5 (ref 5.0–8.0)

## 2019-02-14 LAB — COMPREHENSIVE METABOLIC PANEL
AG Ratio: 1.6 (calc) (ref 1.0–2.5)
ALT: 16 U/L (ref 6–29)
AST: 15 U/L (ref 10–35)
Albumin: 4.4 g/dL (ref 3.6–5.1)
Alkaline phosphatase (APISO): 82 U/L (ref 31–125)
BUN: 15 mg/dL (ref 7–25)
CO2: 26 mmol/L (ref 20–32)
Calcium: 9.9 mg/dL (ref 8.6–10.2)
Chloride: 104 mmol/L (ref 98–110)
Creat: 0.73 mg/dL (ref 0.50–1.10)
Globulin: 2.8 g/dL (calc) (ref 1.9–3.7)
Glucose, Bld: 78 mg/dL (ref 65–99)
Potassium: 4.1 mmol/L (ref 3.5–5.3)
Sodium: 139 mmol/L (ref 135–146)
Total Bilirubin: 0.4 mg/dL (ref 0.2–1.2)
Total Protein: 7.2 g/dL (ref 6.1–8.1)

## 2019-02-14 LAB — NO CULTURE INDICATED

## 2019-02-14 LAB — CBC
HCT: 38.7 % (ref 35.0–45.0)
Hemoglobin: 13.3 g/dL (ref 11.7–15.5)
MCH: 31 pg (ref 27.0–33.0)
MCHC: 34.4 g/dL (ref 32.0–36.0)
MCV: 90.2 fL (ref 80.0–100.0)
MPV: 10.7 fL (ref 7.5–12.5)
Platelets: 236 10*3/uL (ref 140–400)
RBC: 4.29 10*6/uL (ref 3.80–5.10)
RDW: 12 % (ref 11.0–15.0)
WBC: 5.8 10*3/uL (ref 3.8–10.8)

## 2019-02-14 NOTE — Progress Notes (Signed)
   Jade Richards [birth year] [id number]  SUBJECTIVE:  49 y.o. [id number] female for annual routine gynecologic exam. She has no gynecologic concerns. Urinary urgency for a few weeks.  Current Outpatient Medications  Medication Sig Dispense Refill  . FLUoxetine (PROZAC) 10 MG capsule TAKE ONE TAB DAILY 90 capsule 4  . Multiple Vitamin (MULTIVITAMIN) tablet Take 1 tablet by mouth daily.     No current facility-administered medications for this visit.   Allergies: Patient has no known allergies.  Patient's last menstrual period was 01/31/2019.  Past medical history,surgical history, problem list, medications, allergies, family history and social history were all reviewed and documented as reviewed in the EPIC chart.  ROS:  Feeling well. No dyspnea or chest pain on exertion.  No abdominal pain, change in bowel habits, black or bloody stools.  No urinary tract symptoms. GYN ROS: normal menses, no abnormal bleeding, pelvic pain or discharge, no breast pain or new or enlarging lumps on self exam. No neurological complaints.   OBJECTIVE:  BP 118/76   Ht 5\' 4"  (1.626 m)   Wt 168 lb (76.2 kg)   LMP 01/31/2019   BMI 28.84 kg/m  The patient appears well, alert, oriented x 3, in no distress. ENT normal.  Neck supple. No cervical or supraclavicular adenopathy or thyromegaly.  Lungs are clear, good air entry, no wheezes, rhonchi or rales. S1 and S2 normal, no murmurs, regular rate and rhythm.  Abdomen soft without tenderness, guarding, mass or organomegaly.  Neurological is normal, no focal findings.  BREAST EXAM: breasts appear normal, no suspicious masses, no skin or nipple changes or axillary nodes  PELVIC EXAM: VULVA: normal appearing vulva with no masses, tenderness or lesions, VAGINA: normal appearing vagina with normal color and discharge, no lesions, CERVIX: normal appearing cervix without discharge or lesions, UTERUS: uterus is normal size, shape, consistency and nontender, ADNEXA:  normal adnexa in size, nontender and no masses  Chaperone: 02/02/2019 present during the examination  ASSESSMENT:  49 y.o. 58 here for annual gynecologic exam  PLAN:   1. Urinary urgency. Will check UA and culture.  2. History of PMDD.  Remains on fluoxetine and doing well. 3. Prior tubal ligation for contraception. 4. Pap smear 01/2017. Not repeated today.  History of cryosurgery 1990 with normal Pap smears since.  Next Pap smear due 2024 following the current screening guidelines calling for the 5-year interval. 5. Mammogram is overdue.  She unfortunately incurred a high charge for the last scan even though she was told it would be covered by insurance, so I suggested she discuss this while scheduling. Will continue with annual mammography. Breast exam normal today. 6. Health maintenance.  She will proceed to lab today for CBC, CMP, lipid profile.  Return annually or sooner, prn.  2025 MD  02/14/19

## 2019-02-14 NOTE — Patient Instructions (Signed)
It was nice to meet you today! Please remember to schedule your annual mammogram this year and discuss the situation that happened the last time with billing so they can help you avoid that from happening again. We will check your blood work today to follow up cholesterol and lipid levels, and other standard blood and urine tests.

## 2019-03-17 ENCOUNTER — Other Ambulatory Visit: Payer: Self-pay | Admitting: *Deleted

## 2019-03-17 NOTE — Telephone Encounter (Signed)
Per note on 02/14/19 "History of PMDD.  Remains on fluoxetine and doing well."  Okay to refill x 1 year?

## 2019-03-18 MED ORDER — FLUOXETINE HCL 10 MG PO CAPS: ORAL_CAPSULE | ORAL | 4 refills | Status: DC

## 2020-02-16 ENCOUNTER — Encounter: Payer: BC Managed Care – PPO | Admitting: Obstetrics and Gynecology

## 2020-02-17 ENCOUNTER — Ambulatory Visit: Payer: No Typology Code available for payment source | Admitting: Obstetrics and Gynecology

## 2020-02-17 ENCOUNTER — Ambulatory Visit (INDEPENDENT_AMBULATORY_CARE_PROVIDER_SITE_OTHER): Payer: No Typology Code available for payment source | Admitting: Obstetrics and Gynecology

## 2020-02-17 ENCOUNTER — Ambulatory Visit: Payer: BC Managed Care – PPO | Admitting: Obstetrics and Gynecology

## 2020-02-17 ENCOUNTER — Other Ambulatory Visit: Payer: Self-pay

## 2020-02-17 ENCOUNTER — Encounter: Payer: Self-pay | Admitting: Obstetrics and Gynecology

## 2020-02-17 VITALS — BP 118/62 | HR 68 | Resp 16 | Ht 64.5 in | Wt 167.0 lb

## 2020-02-17 DIAGNOSIS — Z01419 Encounter for gynecological examination (general) (routine) without abnormal findings: Secondary | ICD-10-CM

## 2020-02-17 DIAGNOSIS — Z0000 Encounter for general adult medical examination without abnormal findings: Secondary | ICD-10-CM

## 2020-02-17 DIAGNOSIS — R350 Frequency of micturition: Secondary | ICD-10-CM

## 2020-02-17 DIAGNOSIS — Z13220 Encounter for screening for lipoid disorders: Secondary | ICD-10-CM

## 2020-02-17 LAB — URINALYSIS, COMPLETE W/RFL CULTURE
Bacteria, UA: NONE SEEN /HPF
Bilirubin Urine: NEGATIVE
Glucose, UA: NEGATIVE
Hyaline Cast: NONE SEEN /LPF
Ketones, ur: NEGATIVE
Leukocyte Esterase: NEGATIVE
Nitrites, Initial: NEGATIVE
Protein, ur: NEGATIVE
RBC / HPF: NONE SEEN /HPF (ref 0–2)
Specific Gravity, Urine: 1.02 (ref 1.001–1.03)
WBC, UA: NONE SEEN /HPF (ref 0–5)
pH: 7 (ref 5.0–8.0)

## 2020-02-17 LAB — NO CULTURE INDICATED

## 2020-02-17 MED ORDER — FLUOXETINE HCL 10 MG PO CAPS: ORAL_CAPSULE | ORAL | 4 refills | Status: DC

## 2020-02-17 NOTE — Progress Notes (Signed)
   Jade Richards 09/06/1970 [id number]  SUBJECTIVE:  50 y.o. [id number] female for annual routine gynecologic exam. She has no gynecologic concerns. Continues to have urinary urgency symptoms.  Current Outpatient Medications  Medication Sig Dispense Refill  . Ascorbic Acid (VITAMIN C PO) Take by mouth.    . Cyanocobalamin (B-12 PO) Take by mouth.    Tery Sanfilippo Calcium (STOOL SOFTENER PO) Take by mouth.    . Multiple Vitamin (MULTIVITAMIN) tablet Take 1 tablet by mouth daily.    Marland Kitchen FLUoxetine (PROZAC) 10 MG capsule TAKE ONE TAB DAILY 90 capsule 4   No current facility-administered medications for this visit.   Allergies: Patient has no known allergies.  Patient's last menstrual period was 02/03/2020 (exact date).  Past medical history,surgical history, problem list, medications, allergies, family history and social history were all reviewed and documented as reviewed in the EPIC chart.  ROS: Pertinent positives and negatives as reviewed in HPI   OBJECTIVE:  BP 118/62   Pulse 68   Resp 16   Ht 5' 4.5" (1.638 m)   Wt 167 lb (75.8 kg)   LMP 02/03/2020 (Exact Date)   BMI 28.22 kg/m  The patient appears well, alert, oriented x 3, in no distress. ENT normal.  Neck supple. No cervical or supraclavicular adenopathy or thyromegaly.  Lungs are clear, good air entry, no wheezes, rhonchi or rales. S1 and S2 normal, no murmurs, regular rate and rhythm.  Abdomen soft without tenderness, guarding, mass or organomegaly.  Neurological is normal, no focal findings.  BREAST EXAM: breasts appear normal, no suspicious masses, no skin or nipple changes or axillary nodes  PELVIC EXAM: VULVA: normal appearing vulva with no masses, tenderness or lesions, VAGINA: normal appearing vagina with normal color and discharge, no lesions, CERVIX: normal appearing cervix without discharge or lesions, UTERUS: uterus is normal size, shape, consistency and nontender, ADNEXA: normal adnexa in size, nontender and  no masses  Chaperone: Cornelia Copa present during the examination  ASSESSMENT:  50 y.o. [id number] here for annual gynecologic exam  PLAN:   1. Urinary urgency.  UA unremarkable today.  Has been an ongoing issue for her.  No appreciable cystocele on exam.  We discussed behavioral modifications, she indicates she does consume about 2 cups of coffee in the morning which worsens her urinary urgency.  We discussed limiting or eliminating caffeine and common bladder irritant products.  If this is not helpful sufficiently in reducing her symptoms then consider referral to urogynecology. 2. History of PMDD.  Remains on fluoxetine and doing well.  Refill provided fluoxetine 10 mg daily dose x1 year supply. 3. Prior tubal ligation for contraception. 4. Pap smear 01/2017. Not repeated today.  History of cryosurgery 1990 with normal Pap smears since.  Next Pap smear due 2024 following the current screening guidelines calling for the 5-year interval. 5. Mammogram is overdue.  She has been concerned about insurance coverage and I suggested she discuss this while scheduling.  Problems with limited detection capability on clinical breast exam reviewed.  I encouraged her to resume annual mammography. Breast exam normal today. 6. Health maintenance.  She will proceed to lab today for CBC, CMP, lipid profile.  Return annually or sooner, prn.   Theresia Majors MD  02/17/20

## 2020-02-18 LAB — COMPREHENSIVE METABOLIC PANEL
AG Ratio: 1.7 (calc) (ref 1.0–2.5)
ALT: 13 U/L (ref 6–29)
AST: 15 U/L (ref 10–35)
Albumin: 4.5 g/dL (ref 3.6–5.1)
Alkaline phosphatase (APISO): 82 U/L (ref 31–125)
BUN: 12 mg/dL (ref 7–25)
CO2: 28 mmol/L (ref 20–32)
Calcium: 9.5 mg/dL (ref 8.6–10.2)
Chloride: 103 mmol/L (ref 98–110)
Creat: 0.79 mg/dL (ref 0.50–1.10)
Globulin: 2.6 g/dL (calc) (ref 1.9–3.7)
Glucose, Bld: 83 mg/dL (ref 65–99)
Potassium: 3.9 mmol/L (ref 3.5–5.3)
Sodium: 138 mmol/L (ref 135–146)
Total Bilirubin: 0.4 mg/dL (ref 0.2–1.2)
Total Protein: 7.1 g/dL (ref 6.1–8.1)

## 2020-02-18 LAB — CBC
HCT: 40.6 % (ref 35.0–45.0)
Hemoglobin: 13.8 g/dL (ref 11.7–15.5)
MCH: 31.7 pg (ref 27.0–33.0)
MCHC: 34 g/dL (ref 32.0–36.0)
MCV: 93.1 fL (ref 80.0–100.0)
MPV: 10.4 fL (ref 7.5–12.5)
Platelets: 258 10*3/uL (ref 140–400)
RBC: 4.36 10*6/uL (ref 3.80–5.10)
RDW: 12.2 % (ref 11.0–15.0)
WBC: 6.8 10*3/uL (ref 3.8–10.8)

## 2020-02-18 LAB — LIPID PANEL
Cholesterol: 255 mg/dL — ABNORMAL HIGH (ref <200)
HDL: 49 mg/dL — ABNORMAL LOW (ref >=50)
LDL Cholesterol (Calc): 168 mg/dL (calc) — ABNORMAL HIGH
Non-HDL Cholesterol (Calc): 206 mg/dL (calc) — ABNORMAL HIGH (ref <130)
Total CHOL/HDL Ratio: 5.2 (calc) — ABNORMAL HIGH (ref <5.0)
Triglycerides: 223 mg/dL — ABNORMAL HIGH (ref <150)

## 2020-02-25 ENCOUNTER — Ambulatory Visit: Payer: BC Managed Care – PPO | Admitting: Obstetrics and Gynecology

## 2021-03-03 NOTE — Progress Notes (Signed)
Jade Richards 03-08-1970 [id number]   History:  51 y.o. G5P4 presents for annual exam. Complains of lower back cramps with periods, light spotting one for one day and then full flow will start a week later, mood swings (x's 6 months) Interested in increasing prozac.  Gynecologic History Patient's last menstrual period was 02/22/2021. Period Cycle (Days): 30 Period Duration (Days): 3 Period Pattern:  (spots for one day, then full flow starts a week later) Dysmenorrhea: (!) Severe Dysmenorrhea Symptoms:  (severe lower back cramping) Contraception/Family planning: tubal ligation Sexually active: yes Last Pap: 2019. Results were: normal Last mammogram: 2019. Results were: abnormal, asymmetry Left breast, no follow up completed Colonoscopy: yet to have   Obstetric History OB History  Gravida Para Term Preterm AB Living  5 4 4   1 4   SAB IAB Ectopic Multiple Live Births    1          # Outcome Date GA Lbr Len/2nd Weight Sex Delivery Anes PTL Lv  5 Term           4 Term           3 Term           2 Term           1 IAB              The following portions of the patient's history were reviewed and updated as appropriate: allergies, current medications, past family history, past medical history, past social history, past surgical history, and problem list.  Review of Systems Pertinent items noted in HPI and remainder of comprehensive ROS otherwise negative.   Past medical history, past surgical history, family history and social history were all reviewed and documented in the EPIC chart.   Exam:  Vitals:   03/04/21 0823  BP: 126/78  Weight: 180 lb (81.6 kg)  Height: 5' 5.5" (1.664 m)   Body mass index is 29.5 kg/m.  General appearance:  Normal Thyroid:  Symmetrical, normal in size, without palpable masses or nodularity. Respiratory  Auscultation:  Clear without wheezing or rhonchi Cardiovascular  Auscultation:  Regular rate, without rubs, murmurs or  gallops  Edema/varicosities:  Not grossly evident Abdominal  Soft,nontender, without masses, guarding or rebound.  Liver/spleen:  No organomegaly noted  Hernia:  None appreciated  Skin  Inspection:  Grossly normal Breasts: Examined lying and sitting.   Right: Without masses, retractions, nipple discharge or axillary adenopathy.   Left: Without masses, retractions, nipple discharge or axillary adenopathy. Genitourinary   Inguinal/mons:  Normal without inguinal adenopathy  External genitalia:  Normal appearing vulva with no masses, tenderness, or lesions  BUS/Urethra/Skene's glands:  Normal without masses or exudate  Vagina:  Normal appearing with normal color and discharge, no lesions  Cervix:  Normal appearing without discharge or lesions  Uterus:  Normal in size, shape and contour.  Mobile, nontender  Adnexa/parametria:     Rt: Normal in size, without masses or tenderness.   Lt: Normal in size, without masses or tenderness.  Anus and perineum: Normal   Patient informed chaperone available to be present for breast and pelvic exam. Patient has requested no chaperone to be present. Patient has been advised what will be completed during breast and pelvic exam.   Assessment/Plan:   -Well woman exam -BTL for contraception -Dysmenorrhea- ibuprofen 800mg  prevention beginning the day before, declines hormonal management at this time -Pap with cotesting (hx of abnormal paps and cryo) -Mammo overdue, schedule -Colonoscopy; will  refer -Anxiety/mood swings; increase prozac to 20mg  daily May try magnesium glycinate nightly as well   Discussed SBE, colonoscopy and DEXA screening as directed/appropriate. Recommend 154mins of exercise weekly, including weight bearing exercise. Encouraged the use of seatbelts and sunscreen. Return in 1 year for annual or as needed.   Rubbie Battiest B WHNP-BC 8:55 AM 03/04/2021

## 2021-03-04 ENCOUNTER — Ambulatory Visit (INDEPENDENT_AMBULATORY_CARE_PROVIDER_SITE_OTHER): Payer: No Typology Code available for payment source | Admitting: Radiology

## 2021-03-04 ENCOUNTER — Encounter: Payer: Self-pay | Admitting: Radiology

## 2021-03-04 ENCOUNTER — Other Ambulatory Visit (HOSPITAL_COMMUNITY)
Admission: RE | Admit: 2021-03-04 | Discharge: 2021-03-04 | Disposition: A | Discharge status: Home / Self Care | Payer: BLUE CROSS/BLUE SHIELD | Source: Ambulatory Visit | Attending: Radiology | Admitting: Radiology

## 2021-03-04 ENCOUNTER — Other Ambulatory Visit: Payer: Self-pay

## 2021-03-04 VITALS — BP 126/78 | Ht 65.5 in | Wt 180.0 lb

## 2021-03-04 DIAGNOSIS — Z01419 Encounter for gynecological examination (general) (routine) without abnormal findings: Secondary | ICD-10-CM | POA: Insufficient documentation

## 2021-03-04 DIAGNOSIS — F419 Anxiety disorder, unspecified: Secondary | ICD-10-CM | POA: Diagnosis not present

## 2021-03-04 DIAGNOSIS — Z1211 Encounter for screening for malignant neoplasm of colon: Secondary | ICD-10-CM

## 2021-03-04 MED ORDER — FLUOXETINE HCL 20 MG PO CAPS: 20.0000 mg | ORAL_CAPSULE | Freq: Every day | ORAL | 4 refills | Status: DC

## 2021-03-08 LAB — CYTOLOGY - PAP
Comment: NEGATIVE
Diagnosis: NEGATIVE
High risk HPV: NEGATIVE

## 2021-03-23 ENCOUNTER — Other Ambulatory Visit: Payer: Self-pay

## 2021-05-20 ENCOUNTER — Encounter: Payer: Self-pay | Admitting: Gastroenterology

## 2021-06-03 ENCOUNTER — Ambulatory Visit (AMBULATORY_SURGERY_CENTER): Payer: Self-pay | Admitting: *Deleted

## 2021-06-03 VITALS — Ht 64.0 in | Wt 177.6 lb

## 2021-06-03 DIAGNOSIS — Z1211 Encounter for screening for malignant neoplasm of colon: Secondary | ICD-10-CM

## 2021-06-03 MED ORDER — NA SULFATE-K SULFATE-MG SULF 17.5-3.13-1.6 GM/177ML PO SOLN: 1.0000 | Freq: Once | ORAL | 0 refills | Status: AC

## 2021-06-03 NOTE — Progress Notes (Signed)

## 2021-06-21 ENCOUNTER — Encounter: Payer: Self-pay | Admitting: Gastroenterology

## 2021-06-24 ENCOUNTER — Ambulatory Visit (AMBULATORY_SURGERY_CENTER): Payer: Self-pay | Admitting: Gastroenterology

## 2021-06-24 ENCOUNTER — Encounter: Payer: Self-pay | Admitting: Gastroenterology

## 2021-06-24 VITALS — BP 99/58 | HR 87 | Temp 98.4°F | Resp 22 | Ht 64.0 in | Wt 177.6 lb

## 2021-06-24 DIAGNOSIS — Z1211 Encounter for screening for malignant neoplasm of colon: Secondary | ICD-10-CM

## 2021-06-24 MED ORDER — SODIUM CHLORIDE 0.9 % IV SOLN: 500.0000 mL | Freq: Once | INTRAVENOUS | Status: DC

## 2021-06-24 NOTE — Op Note (Signed)
[facility] Patient Name: Jade Richards Procedure Date: 06/24/2021 1:53 PM MRN: [id number] Endoscopist: Sherilyn Cooter L. Myrtie Neither , MD Age: 51 Referring MD:  Date of Birth: Jan 12, 1971 Gender: Female Account #: [id number] Procedure:                Colonoscopy Indications:              Screening for colorectal malignant neoplasm, This                            is the patient's first colonoscopy Medicines:                Monitored Anesthesia Care Procedure:                Pre-Anesthesia Assessment:                           - Prior to the procedure, a History and Physical                            was performed, and patient medications and                            allergies were reviewed. The patient's tolerance of                            previous anesthesia was also reviewed. The risks                            and benefits of the procedure and the sedation                            options and risks were discussed with the patient.                            All questions were answered, and informed consent                            was obtained. Prior Anticoagulants: The patient has                            taken no previous anticoagulant or antiplatelet                            agents. ASA Grade Assessment: II - A patient with                            mild systemic disease. After reviewing the risks                            and benefits, the patient was deemed in                            satisfactory condition to undergo the procedure.  After obtaining informed consent, the colonoscope                            was passed under direct vision. Throughout the                            procedure, the patient's blood pressure, pulse, and                            oxygen saturations were monitored continuously. The                            CF [id number] #[id number] was introduced through the anus                            and advanced to the  the terminal ileum, with                            identification of the appendiceal orifice and IC                            valve. The colonoscopy was performed without                            difficulty. The patient tolerated the procedure                            well. The quality of the bowel preparation was                            excellent. The terminal ileum, appendiceal orifice                            and rectum were photographed. Scope In: 2:03:22 PM Scope Out: 2:12:46 PM Scope Withdrawal Time: 0 hours 7 minutes 15 seconds  Total Procedure Duration: 0 hours 9 minutes 24 seconds  Findings:                 The perianal and digital rectal examinations were                            normal.                           The terminal ileum appeared normal.                           Repeat examination of right colon under NBI                            performed.                           The entire examined colon appeared normal on direct  and retroflexion views. Complications:            No immediate complications. Estimated Blood Loss:     Estimated blood loss: none. Impression:               - The examined portion of the ileum was normal.                           - The entire examined colon is normal on direct and                            retroflexion views.                           - No specimens collected. Recommendation:           - Patient has a contact number available for                            emergencies. The signs and symptoms of potential                            delayed complications were discussed with the                            patient. Return to normal activities tomorrow.                            Written discharge instructions were provided to the                            patient.                           - Resume previous diet.                           - Continue present medications.                            - Repeat colonoscopy in 10 years for screening                            purposes. Yaqub Arney L. Myrtie Neitheranis, MD 06/24/2021 2:15:54 PM This report has been signed electronically.

## 2021-06-24 NOTE — Progress Notes (Signed)
History and Physical:  This patient presents for endoscopic testing for: Encounter Diagnosis  Name Primary?   Special screening for malignant neoplasms, colon Yes    First screening exam. Occasional constipation, no rectal bleeding  Patient is otherwise without complaints or active issues today.   Past Medical History: Past Medical History:  Diagnosis Date   Cervical dysplasia early 1990's   Depression    Ganglion cyst    right foot   Ovarian cyst    Substance abuse (HCC)      Past Surgical History: Past Surgical History:  Procedure Laterality Date   APPENDECTOMY  1989   CESAREAN SECTION     x3   GYNECOLOGIC CRYOSURGERY     OVARIAN CYST REMOVAL     TUBAL LIGATION      Allergies: No Known Allergies  Outpatient Meds: Current Outpatient Medications  Medication Sig Dispense Refill   Ascorbic Acid (VITAMIN C PO) Take by mouth daily.     Cyanocobalamin (B-12 PO) Take by mouth daily.     FLUoxetine (PROZAC) 20 MG capsule Take 1 capsule (20 mg total) by mouth daily. 90 capsule 4   Multiple Vitamin (MULTIVITAMIN) tablet Take 1 tablet by mouth daily.     Docusate Calcium (STOOL SOFTENER PO) Take by mouth.     Current Facility-Administered Medications  Medication Dose Route Frequency Provider Last Rate Last Admin   0.9 %  sodium chloride infusion  500 mL Intravenous Once Sherrilyn Rist, MD          ___________________________________________________________________ Objective   Exam:  BP 122/72   Pulse 98   Temp 98.4 F (36.9 C)   Ht 5\' 4"  (1.626 m)   Wt 177 lb 9.6 oz (80.6 kg)   LMP 06/02/2021 (Approximate)   BMI 30.48 kg/m   CV: RRR without murmur, S1/S2 Resp: clear to auscultation bilaterally, normal RR and effort noted GI: soft, no tenderness, with active bowel sounds.   Assessment: Encounter Diagnosis  Name Primary?   Special screening for malignant neoplasms, colon Yes     Plan: Colonoscopy  The benefits and risks of the planned  procedure were described in detail with the patient or (when appropriate) their health care proxy.  Risks were outlined as including, but not limited to, bleeding, infection, perforation, adverse medication reaction leading to cardiac or pulmonary decompensation, pancreatitis (if ERCP).  The limitation of incomplete mucosal visualization was also discussed.  No guarantees or warranties were given.    The patient is appropriate for an endoscopic procedure in the ambulatory setting.   - 06/04/2021, MD

## 2021-06-24 NOTE — Progress Notes (Signed)
A and O x3. Report to RN. Tolerated MAC anesthesia well. 

## 2021-06-24 NOTE — Progress Notes (Signed)
Vs by DT  Pt's states no medical or surgical changes since previsit or office visit.  

## 2021-06-24 NOTE — Patient Instructions (Signed)

## 2021-06-27 ENCOUNTER — Telehealth: Payer: Self-pay | Admitting: *Deleted

## 2021-06-27 NOTE — Telephone Encounter (Signed)
  Follow up Call-     06/24/2021    1:17 PM  Call back number  Post procedure Call Back phone  # [phone]  Permission to leave phone message Yes     Patient questions:  Do you have a fever, pain , or abdominal swelling? No. Pain Score  0 *  Have you tolerated food without any problems? No.  Have you been able to return to your normal activities? No.  Do you have any questions about your discharge instructions: Diet   No. Medications  No. Follow up visit  No.  Do you have questions or concerns about your Care? No.  Actions: * If pain score is 4 or above: No action needed, pain <4.

## 2021-07-01 ENCOUNTER — Encounter: Payer: Self-pay | Admitting: Gastroenterology

## 2021-09-21 ENCOUNTER — Encounter: Payer: Self-pay | Admitting: Internal Medicine

## 2021-10-25 ENCOUNTER — Encounter: Payer: Self-pay | Admitting: Internal Medicine

## 2022-03-07 ENCOUNTER — Encounter: Payer: Self-pay | Admitting: Radiology

## 2022-03-07 ENCOUNTER — Ambulatory Visit (INDEPENDENT_AMBULATORY_CARE_PROVIDER_SITE_OTHER): Payer: No Typology Code available for payment source | Admitting: Radiology

## 2022-03-07 VITALS — BP 110/64 | Ht 64.0 in | Wt 179.0 lb

## 2022-03-07 DIAGNOSIS — Z01419 Encounter for gynecological examination (general) (routine) without abnormal findings: Secondary | ICD-10-CM

## 2022-03-07 NOTE — Progress Notes (Signed)
   Jade Richards 1970-02-20 [id number]   History:  52 y.o. G5P4 presents for annual exam. No concerns. Cycles still regular.  Gynecologic History Patient's last menstrual period was 02/21/2022 (approximate). Period Cycle (Days): 28 Period Duration (Days): 4 Period Pattern: Regular Menstrual Flow: Heavy Menstrual Control: Tampon, Maxi pad Dysmenorrhea: (!) Severe Dysmenorrhea Symptoms: Cramping Contraception/Family planning: tubal ligation Sexually active: yes Last Pap: 2023. Results were: normal Last mammogram: 2019. Results were: normal  Obstetric History OB History  Gravida Para Term Preterm AB Living  5 4 4   1 4  $ SAB IAB Ectopic Multiple Live Births    1          # Outcome Date GA Lbr Len/2nd Weight Sex Delivery Anes PTL Lv  5 Term           4 Term           3 Term           2 Term           1 IAB              The following portions of the patient's history were reviewed and updated as appropriate: allergies, current medications, past family history, past medical history, past social history, past surgical history, and problem list.  Review of Systems Pertinent items noted in HPI and remainder of comprehensive ROS otherwise negative.   Past medical history, past surgical history, family history and social history were all reviewed and documented in the EPIC chart.   Exam:  Vitals:   03/07/22 0835  BP: 110/64  Weight: 179 lb (81.2 kg)  Height: 5' 4"$  (1.626 m)   Body mass index is 30.73 kg/m.  General appearance:  Normal Thyroid:  Symmetrical, normal in size, without palpable masses or nodularity. Respiratory  Auscultation:  Clear without wheezing or rhonchi Cardiovascular  Auscultation:  Regular rate, without rubs, murmurs or gallops  Edema/varicosities:  Not grossly evident Abdominal  Soft,nontender, without masses, guarding or rebound.  Liver/spleen:  No organomegaly noted  Hernia:  None appreciated  Skin  Inspection:  Grossly  normal Breasts: Examined lying and sitting.   Right: Without masses, retractions, nipple discharge or axillary adenopathy.   Left: Without masses, retractions, nipple discharge or axillary adenopathy. Genitourinary   Inguinal/mons:  Normal without inguinal adenopathy  External genitalia:  Normal appearing vulva with no masses, tenderness, or lesions  BUS/Urethra/Skene's glands:  Normal without masses or exudate  Vagina:  Normal appearing with normal color and discharge, no lesions  Cervix:  Normal appearing without discharge or lesions  Uterus:  Normal in size, shape and contour.  Mobile, nontender  Adnexa/parametria:     Rt: Normal in size, without masses or tenderness.   Lt: Normal in size, without masses or tenderness.  Anus and perineum: Normal   Patient informed chaperone available to be present for breast and pelvic exam. Patient has requested no chaperone to be present. Patient has been advised what will be completed during breast and pelvic exam.   Assessment/Plan:   1. Well woman exam with routine gynecological exam Pap due 2028 Overdue for mammogram, schedule at [facility] Up to date on colonoscopy Schedule physical and labs with PCP      Discussed SBE, colonoscopy and DEXA screening as directed/appropriate. Recommend 179mns of exercise weekly, including weight bearing exercise. Encouraged the use of seatbelts and sunscreen. Return in 1 year for annual or as needed.   CRubbie BattiestB WHNP-BC 8:55 AM 03/07/2022

## 2022-04-05 ENCOUNTER — Other Ambulatory Visit: Payer: Self-pay | Admitting: Radiology

## 2022-04-05 DIAGNOSIS — F419 Anxiety disorder, unspecified: Secondary | ICD-10-CM

## 2022-04-05 NOTE — Telephone Encounter (Signed)
Last AEX 03/07/2022--recall placed for 2025.  Rx pend.
